# Patient Record
Sex: Female | Born: 1970 | Race: Black or African American | Hispanic: No | Smoking: Current some day smoker
Health system: Southern US, Community
[De-identification: ages and names within clinical notes are randomized; demographics above are authoritative.]

## PROBLEM LIST (undated history)

## (undated) DIAGNOSIS — I890 Lymphedema, not elsewhere classified: Secondary | ICD-10-CM

## (undated) DIAGNOSIS — M069 Rheumatoid arthritis, unspecified: Secondary | ICD-10-CM

## (undated) DIAGNOSIS — C801 Malignant (primary) neoplasm, unspecified: Secondary | ICD-10-CM

## (undated) HISTORY — PX: BREAST LUMPECTOMY: SHX2

## (undated) HISTORY — DX: Rheumatoid arthritis, unspecified: M06.9

## (undated) SURGERY — Surgical Case
Anesthesia: *Unknown

---

## 2002-08-20 ENCOUNTER — Emergency Department (HOSPITAL_COMMUNITY): Admission: EM | Admit: 2002-08-20 | Discharge: 2002-08-20 | Payer: Self-pay | Admitting: Emergency Medicine

## 2002-08-20 ENCOUNTER — Encounter: Payer: Self-pay | Admitting: Emergency Medicine

## 2003-11-17 ENCOUNTER — Emergency Department (HOSPITAL_COMMUNITY): Admission: EM | Admit: 2003-11-17 | Discharge: 2003-11-17 | Payer: Self-pay | Admitting: Emergency Medicine

## 2004-03-20 ENCOUNTER — Ambulatory Visit (HOSPITAL_COMMUNITY): Admission: RE | Admit: 2004-03-20 | Discharge: 2004-03-20 | Payer: Self-pay | Admitting: Family Medicine

## 2004-05-20 ENCOUNTER — Inpatient Hospital Stay (HOSPITAL_COMMUNITY): Admission: RE | Admit: 2004-05-20 | Discharge: 2004-05-21 | Payer: Self-pay | Admitting: Obstetrics and Gynecology

## 2006-06-16 ENCOUNTER — Observation Stay (HOSPITAL_COMMUNITY): Admission: AD | Admit: 2006-06-16 | Discharge: 2006-06-17 | Payer: Self-pay | Admitting: Obstetrics and Gynecology

## 2007-03-20 ENCOUNTER — Emergency Department (HOSPITAL_COMMUNITY): Admission: EM | Admit: 2007-03-20 | Discharge: 2007-03-20 | Payer: Self-pay | Admitting: Emergency Medicine

## 2008-08-13 ENCOUNTER — Ambulatory Visit: Payer: Self-pay | Admitting: Family Medicine

## 2008-08-13 DIAGNOSIS — C50919 Malignant neoplasm of unspecified site of unspecified female breast: Secondary | ICD-10-CM | POA: Insufficient documentation

## 2008-08-13 DIAGNOSIS — O039 Complete or unspecified spontaneous abortion without complication: Secondary | ICD-10-CM | POA: Insufficient documentation

## 2008-08-13 DIAGNOSIS — M545 Low back pain: Secondary | ICD-10-CM

## 2008-08-13 DIAGNOSIS — M25569 Pain in unspecified knee: Secondary | ICD-10-CM | POA: Insufficient documentation

## 2010-12-06 ENCOUNTER — Encounter: Payer: Self-pay | Admitting: Family Medicine

## 2011-04-02 NOTE — Discharge Summary (Signed)
NAME:  [name], ROSELLO                       ACCOUNT NO.:  [id number]   MEDICAL RECORD NO.:  [id number]                   PATIENT TYPE:  OUT   LOCATION:  RAD                                  FACILITY:  [facility]   PHYSICIAN:  Tilda Burrow, M.D.              DATE OF BIRTH:  [birth year]   DATE OF ADMISSION:  05/20/2004  DATE OF DISCHARGE:  05/21/2004                                 DISCHARGE SUMMARY   ADMISSION DIAGNOSES:  Pregnancy [redacted] week gestation, fetal demise in utero.   DISCHARGE DIAGNOSES:  1. Pregnancy [redacted] week gestation.  2. Fetal demise in utero.  3. Intrauterine growth retardation.  4. Placental bed hematoma with atypical placental tissues.   HISTORY OF PRESENT ILLNESS:  This 40 year old female gravida 3, para 0, AB 1  was admitted after being followed initially at [facility]  and transferred to our care on April 27, 2004 with prenatal course considered  initially appropriate with ultrasound estimated date of confinement of  October 04, 2004. She had noticed a decrease in fetal movement since May 15, 2004. When presenting to labor and delivery on May 20, 2004, she was  found by ultrasound to have no fetal motion and fetal demise in utero  confirmed.   PAST MEDICAL HISTORY:  Positive for history of breast cancer in 2001,  treated with lumpectomy and radiation therapy and chemotherapy. No adjuvant  therapies during this pregnancy.   OBSTETRIC HISTORY:  A miscarriage in 1999, first trimester D&C and then an  elective abortion in 1992.   ALLERGIES:  None.   MEDICATIONS:  None.   PHYSICAL EXAMINATION:  GENERAL:  A tearful appropriately grieving African-  American female with fundal height just below the umbilicus. Fetal heart  tones absent. Ultrasound shows no fetal heart motion present. Amniotic fluid  is grossly normal in volume. Blood type is O negative. Rubella immunity  present.   HOSPITAL COURSE:  The patient was admitted. Time given for  initial grief  reaction. Then we started with induction. She progressed to delivery of a  stillborn small 225 gram fetus with placenta distinctly abnormal. It was a  central 2 cm area beneath the placenta that had a blood clot. She fetus  weighed 225 grams and the placenta weighed only 85 grams, distinctly  atypical in its appearance, very thin except for the small area in the  center, which is exactly where the clot had occurred. Pathology evaluation  identified the placenta weighed 62 grams after fixation. The  central hematoma was near the placental bed. Postoperative course was  stable. On the day of delivery, she wished to go home to be with supportive  family members.   ADDRESS:  Blood type O negative. RhoGAM was administered.     ___________________________________________  Tilda Burrow, M.D.   JVF/MEDQ  D:  05/31/2004  T:  06/01/2004  Job:  [id number]

## 2011-04-02 NOTE — Op Note (Signed)
NAME:  [name], [name]                       ACCOUNT NO.:  [id number]   MEDICAL RECORD NO.:  [id number]                   PATIENT TYPE:  INP   LOCATION:  A416                                 FACILITY:  [facility]   PHYSICIAN:  Tilda Burrow, M.D.              DATE OF BIRTH:  16-Jul-1971   DATE OF PROCEDURE:  05/20/2004  DATE OF DISCHARGE:                                 OPERATIVE REPORT   Procedure note and delivery note, 2:00 p.m. on May 20, 2004.   EPIDURAL NOTE:  Patient requested epidural which was placed with patient in  the sitting position, flexed forward.  Nursing exam earlier had shown the  cervix to be very posterior and difficult to access.  We placed epidural  using loss of resistance technique on first attempt, identifying the  epidural space at 4.5 cm beneath the skin.  A 5 mL test dose of 1.5%  Xylocaine with epinephrine was instilled followed by placement of the  epidural catheter to a depth of 4 cm followed by taping to the back and  beginning a 10 mL bolus.  The patient had good symmetric anesthetic effect  beginning to set up on the thighs.  She still had sense of pressure and so  we did a cervical exam which showed the cervix to be 2-3 cm with bulging  forewaters and vertex beginning to push through the cervix at approximately  3 cm dilation.  The patient then progressed within two contractions to begin  to feel eminent delivery.  She was inspected and presenting part notable at  the introitus.  She was able to push and pushed out the intact membrane  containing the intact bag of waters and placenta, intact, with fluid  contained within the sac.  The patient did not wish to see the baby  initially and the fetus and placenta were taken to the nursery and weighed.  The fetus weighed 225 grams, 8.0 ounces, and appeared grossly normal.  The  placenta appeared abnormally small with a small central 2 cm button of dark  ecchymotic tissue just below the cord insertion and the  remainder of the  very small 10 x 10 cm symmetric placenta found to be very thin and with an  atypical surface appearance, suggestive that it had already separated off.  The weight of the placenta was 85 grams, atypically small for this  gestation, cord appeared grossly normal.  The cord insertion was centrally  located.  Photos were taken.  Patient had epidural catheter removed and  after a discussion desired to see the infant.  Placenta will be sent for  pathology.   ADDENDUM:  Addendum catheter tip was intact when it was removed.      ___________________________________________  Tilda Burrow, M.D.   JVF/MEDQ  D:  05/20/2004  T:  05/20/2004  Job:  [id number]

## 2011-04-02 NOTE — H&P (Signed)
[name], [name]             ACCOUNT NO.:  [id number]   MEDICAL RECORD NO.:  [id number]          PATIENT TYPE:  OIB   LOCATION:  LDR2                          FACILITY:  [facility]   PHYSICIAN:  Tilda Burrow, M.D. DATE OF BIRTH:  [birth year]   DATE OF ADMISSION:  06/16/2006  DATE OF DISCHARGE:  LH                                HISTORY & PHYSICAL   ADMISSION DIAGNOSES:  1.  Premature preterm rupture of membranes, [redacted] weeks gestation.  Poor      reproductive history.  2.  History of breast cancer, status post chemotherapy.   HISTORY:  This is a 40 year old female, gravida 4, para 0-1-2-0, at 19  weeks, having received prenatal care at Dr. Maryelizabeth Kaufmann at [facility] who  presents with premature preterm rupture of membranes (PPROM) with gush of  fluid noted this afternoon.  No bleeding, no prenatal complications to date.  She was scheduled for fetal survey ultrasound next week. The patient has had  a difficult reproductive history including a stillbirth in 2005 at [redacted] weeks  gestation.  She was found to have a placental infarction with a severe  growth retarded infant.  She has a significant history of cancer of the left  breast treated with lumpectomy and 42 weeks of chemotherapy and subsequent  adjuvant radiation therapy an additional six weeks.   PAST MEDICAL HISTORY:  Otherwise negative.   ALLERGIES:  None.   PAST SURGICAL HISTORY:  None other than D&C.   PHYSICAL EXAMINATION:  GENERAL:  A highly anxious, upset African American  female with good support.  Family tearful, strong, religious basis.  The  patient is alert, oriented, aware of what is going and wishes to avoid the  reality,  but understands the adverse risks of absence of fluid.  HEENT:  Pupils equal, round, and reactive.   Ultrasound shows single fetus, BPD 43 mm equals 18 weeks 2 days; femur  length 30 mm equal 19 weeks 3 days.  Vertex presentation, severe  oligohydramnios. Speculum exam shows gross pooling of fluid  which pours out  with speculum exam.  Cannot see cervix through the fluid pooling and  continued loss.   IMPRESSION:  Premature preterm rupture of membranes 19 weeks, put on  intravenous antibiotics.  Will be seen in the a.m.  Probable prostaglandin  suppository.  The patient accepts pregnancy loss. I have lengthy discussions  with  the patient's mother and the patient of her prognosis for the pregnancy. We  have also talked to the baby's father and his mother.  Will ultrasound in  the a.m.  In the event that the fluid is not recollected, will recommend  prostaglandin suppository, evacuation of uterus.      Tilda Burrow, M.D.  Electronically Signed     JVF/MEDQ  D:  06/16/2006  T:  06/17/2006  Job:  [id number]   cc:   [facility]   Rozann Lesches  Fax: [phone]

## 2011-04-02 NOTE — H&P (Signed)
NAME:  [name], [name]                       ACCOUNT NO.:  [id number]   MEDICAL RECORD NO.:  [id number]                   PATIENT TYPE:  OIB   LOCATION:  A416                                 FACILITY:  [facility]   PHYSICIAN:  Tilda Burrow, M.D.              DATE OF BIRTH:  [birth year]   DATE OF ADMISSION:  05/20/2004  DATE OF DISCHARGE:                                HISTORY & PHYSICAL   ADMISSION DIAGNOSES:  1. Pregnancy at 23 weeks' gestation.  2. Fetal demise in utero.   HISTORY OF PRESENT ILLNESS:  This 40 year old female, gravida 3, para 0, AB  1, LMP February 22, 2004, placing menstrual [facility] October 04, 2004, was admitted  on May 20, 2004, after presenting with absence of fetal movement since last  Friday.  Pregnancy course has been followed initially at [facility] and transferring to our care June 13.  She was seen for  intake physical at that time, history taken, and fetal heart tones confirmed  with fundal height near the umbilicus at 20 weeks 3 days.  She was seen  eight days later for ultrasound, which showed a suspected gestational age of  [redacted] weeks 2 days, placing her due slightly different.  Ultrasound EDC is  October 04, 2004, based on this ultrasound.  In reviewing prior records,  prior ultrasound at 11 weeks 6 days on Mar 20, 2004, also matched up with the  [facility] of October 03, 2004, suggesting appropriate interval growth.  The  patient has had no bleeding, no complications.  She noticed absent fetal  movement since July 1.  Ultrasound done upon presentation shows a small  fetus in an abnormally collapsed position with no fetal movement and absence  of fetal heart motion on prolonged monitoring and prolonged ultrasound.  The  patient has been informed of our findings and is appropriately grieving.   Plans will be made for cervical ripening with cervical laminaria to be  followed by prostaglandin suppositories.   PAST MEDICAL HISTORY:  Notable for a  history of left breast cancer, 2001,  treated with lumpectomy, radiation therapy, and chemotherapy and neoadjuvant  therapy.  Exact agents are not a part of the records at this time and will  be identified.  She had complications during the chemotherapy of deep vein  thrombosis associated with the PICC line that was placed for chemotherapy  management.  That PICC line was subsequently removed, and there are no  subsequent problems.   Additional OB history is that she has had miscarriage in 1999 in the first  trimester and treated by [facility] and elective AB in 1992.   ALLERGIES:  None.   MEDICATIONS:  None.   SOCIAL HISTORY:  Single.  Mother and father alive and well.  One brother,  alive and well.  She is related to hospital staff member, Jasmine December in the lab.   PHYSICAL EXAMINATION:  GENERAL:  A  pleasant, tearful, appropriately grieving  African-American female, alert and oriented x3.  ABDOMEN:  Fundal height is just below the umbilicus with fetal heart tones  absent and absence of fetal heart movement on ultrasound.  The fetus is in  vertex presentation.  Amniotic fluid is grossly normal to appearance.  EXTREMITIES:  Grossly normal.  PELVIC:  Deferred at this time.   Blood type O negative.  Rubella immunity present.  Hemoglobin 13, hematocrit  39.  HIV negative.  Pap smear performed in the past.  MSAFP one in 500 risk  on old records.   PLAN:  Will work with family at their pace today and proceed with assisting  with delivery of fetal demise in utero.     ___________________________________________                                         Tilda Burrow, M.D.   JVF/MEDQ  D:  05/20/2004  T:  05/20/2004  Job:  [id number]

## 2016-06-12 ENCOUNTER — Emergency Department (HOSPITAL_COMMUNITY)
Admission: EM | Admit: 2016-06-12 | Discharge: 2016-06-12 | Disposition: A | Discharge status: Home / Self Care | Payer: Medicaid Other | Attending: Emergency Medicine | Admitting: Emergency Medicine

## 2016-06-12 ENCOUNTER — Emergency Department (HOSPITAL_COMMUNITY): Payer: Medicaid Other

## 2016-06-12 ENCOUNTER — Encounter (HOSPITAL_COMMUNITY): Payer: Self-pay | Admitting: Emergency Medicine

## 2016-06-12 DIAGNOSIS — M549 Dorsalgia, unspecified: Secondary | ICD-10-CM

## 2016-06-12 DIAGNOSIS — F17210 Nicotine dependence, cigarettes, uncomplicated: Secondary | ICD-10-CM | POA: Diagnosis not present

## 2016-06-12 DIAGNOSIS — Z791 Long term (current) use of non-steroidal anti-inflammatories (NSAID): Secondary | ICD-10-CM | POA: Diagnosis not present

## 2016-06-12 DIAGNOSIS — Z79899 Other long term (current) drug therapy: Secondary | ICD-10-CM | POA: Diagnosis not present

## 2016-06-12 DIAGNOSIS — M545 Low back pain: Secondary | ICD-10-CM | POA: Insufficient documentation

## 2016-06-12 HISTORY — DX: Malignant (primary) neoplasm, unspecified: C80.1

## 2016-06-12 LAB — URINALYSIS, ROUTINE W REFLEX MICROSCOPIC
Bilirubin Urine: NEGATIVE
Glucose, UA: NEGATIVE mg/dL
Ketones, ur: NEGATIVE mg/dL
Leukocytes, UA: NEGATIVE
Nitrite: NEGATIVE
Protein, ur: NEGATIVE mg/dL
Specific Gravity, Urine: 1.025 (ref 1.005–1.030)
pH: 6 (ref 5.0–8.0)

## 2016-06-12 LAB — URINE MICROSCOPIC-ADD ON

## 2016-06-12 LAB — PREGNANCY, URINE: Preg Test, Ur: NEGATIVE

## 2016-06-12 MED ORDER — IBUPROFEN 800 MG PO TABS: 800.0000 mg | ORAL_TABLET | Freq: Three times a day (TID) | ORAL | 0 refills | Status: DC

## 2016-06-12 MED ORDER — OXYCODONE-ACETAMINOPHEN 5-325 MG PO TABS
2.0000 | ORAL_TABLET | Freq: Once | ORAL | Status: AC
  Administered 2016-06-12: 2 via ORAL
  Filled 2016-06-12: qty 2

## 2016-06-12 MED ORDER — METHOCARBAMOL 500 MG PO TABS: 500.0000 mg | ORAL_TABLET | Freq: Two times a day (BID) | ORAL | 0 refills | Status: DC | PRN

## 2016-06-12 NOTE — ED Provider Notes (Signed)
[place] DEPT Provider Note   CSN: [id number] Arrival date & time: 06/12/16  [id number]  First Provider Contact:  First MD Initiated Contact with Patient 06/12/16 0740        History   Chief Complaint Chief Complaint  Patient presents with  . Back Pain    HPI [name] is a 45 y.o. female.  The patient is a 45 year old female, she has a history of breast cancer diagnosed in her late 54s, she underwent a lumpectomy of the left breast and states that she has been cancer free for 20 years, follows with a cancer clinic, most recently seen 6 months ago. She does not have any history of back pain or trauma or back surgery, she has no history of fevers, urinary symptoms or IV drug use. She reports that approximately one week ago while she was getting from a sitting to a standing position out of a chair while she was in class, she developed acute onset of lower back pain where she felt as though her back was acutely painful and locked up. Since that time she has had almost persistent pain, seems to be worse with change of position especially with going from a sitting to a standing position. The pain is constant, seems to be located in the mid lower back, there is no radiation to the legs, no numbness or weakness of the legs, no urinary retention, no frequency, no dysuria. She has no history of IV drug use and has not had any tattoos in over 20 years. She has used her home medications including her Flexeril without relief.      Past Medical History:  Diagnosis Date  . Cancer [facility])     Patient Active Problem List   Diagnosis Date Noted  . NEOPLASM, MALIGNANT, BREAST, LEFT 08/13/2008  . UNSPEC SPONTANEOUS AB WITHOUT MENTION COMP 08/13/2008  . KNEE PAIN, RIGHT 08/13/2008  . BACK PAIN, LUMBAR 08/13/2008    Past Surgical History:  Procedure Laterality Date  . BREAST LUMPECTOMY Left     OB History    Gravida Para Term Preterm AB Living   1 1   1   1    SAB TAB Ectopic Multiple  Live Births                   Home Medications    Prior to Admission medications   Medication Sig Start Date End Date Taking? Authorizing Provider  ibuprofen (ADVIL,MOTRIN) 800 MG tablet Take 1 tablet (800 mg total) by mouth 3 (three) times daily. 06/12/16   Noemi Chapel, MD  methocarbamol (ROBAXIN) 500 MG tablet Take 1 tablet (500 mg total) by mouth 2 (two) times daily as needed for muscle spasms. 06/12/16   Noemi Chapel, MD    Family History Family History  Problem Relation Age of Onset  . Hypertension Other   . Diabetes Other     Social History Social History  Substance Use Topics  . Smoking status: Current Every Day Smoker    Packs/day: 0.50    Years: 10.00    Types: Cigarettes  . Smokeless tobacco: Never Used  . Alcohol use No     Allergies   Amoxicillin   Review of Systems Review of Systems  All other systems reviewed and are negative.    Physical Exam Updated Vital Signs BP 124/79 (BP Location: Right Arm)   Pulse 80   Temp 97.8 F (36.6 C) (Oral)   Resp 16   Ht 5\' 5"  (1.651 m)  Wt 160 lb (72.6 kg)   LMP 05/22/2016 (Approximate)   SpO2 98%   BMI 26.63 kg/m   Physical Exam  Constitutional: She appears well-developed and well-nourished. No distress.  HENT:  Head: Normocephalic and atraumatic.  Mouth/Throat: Oropharynx is clear and moist. No oropharyngeal exudate.  Eyes: Conjunctivae and EOM are normal. Pupils are equal, round, and reactive to light. Right eye exhibits no discharge. Left eye exhibits no discharge. No scleral icterus.  Neck: Normal range of motion. Neck supple. No JVD present. No thyromegaly present.  Cardiovascular: Normal rate, regular rhythm, normal heart sounds and intact distal pulses.  Exam reveals no gallop and no friction rub.   No murmur heard. Pulmonary/Chest: Effort normal and breath sounds normal. No respiratory distress. She has no wheezes. She has no rales.  Abdominal: Soft. Bowel sounds are normal. She exhibits no  distension and no mass. There is no tenderness.  Musculoskeletal: Normal range of motion. She exhibits tenderness ( Minimal tenderness with palpation over the mid lumbar spine, no paraspinal tenderness, no thoracic tenderness). She exhibits no edema.  Lymphadenopathy:    She has no cervical adenopathy.  Neurological: She is alert. Coordination normal.  The patient is able to straight leg raise bilaterally against resistance with normal strength, she has normal sensation to light touch in all 4 extremities, normal coordination, normal speech  Skin: Skin is warm and dry. No rash noted. No erythema.  Psychiatric: She has a normal mood and affect. Her behavior is normal.  Nursing note and vitals reviewed.    ED Treatments / Results  Labs (all labs ordered are listed, but only abnormal results are displayed) Labs Reviewed  URINALYSIS, ROUTINE W REFLEX MICROSCOPIC (NOT AT [facility]) - Abnormal; Notable for the following:       Result Value   Hgb urine dipstick MODERATE (*)    All other components within normal limits  URINE MICROSCOPIC-ADD ON - Abnormal; Notable for the following:    Squamous Epithelial / LPF 6-30 (*)    Bacteria, UA FEW (*)    All other components within normal limits  PREGNANCY, URINE     Radiology Dg Lumbar Spine Complete  Result Date: 06/12/2016 CLINICAL DATA:  Low back pain. EXAM: LUMBAR SPINE - COMPLETE 4+ VIEW COMPARISON:  None. FINDINGS: Tiny anterior osteophytes at L3-4. No fracture or malalignment. No other abnormalities. IMPRESSION: No cause for the patient's pain identified. Electronically Signed   By: Dorise Bullion III M.D   On: 06/12/2016 09:08   Procedures Procedures (including critical care time)  Medications Ordered in ED Medications  oxyCODONE-acetaminophen (PERCOCET/ROXICET) 5-325 MG per tablet 2 tablet (2 tablets Oral Given 06/12/16 [id number])   Initial Impression / Assessment and Plan / ED Course  I have reviewed the triage vital signs and the nursing  notes.  Pertinent labs & imaging results that were available during my care of the patient were reviewed by me and considered in my medical decision making (see chart for details).  Clinical Course  Comment By Time  Urinalysis negative, imaging negative for acute findings that would be causing her pain, specifically no signs of metastatic disease. The patient appears well, she appears stable for discharge and can follow-up in the outpatient setting. She was informed of all of her results Noemi Chapel, MD 07/29 [phone]    Back pain is new for this patient, doubt fracture, however with her history of breast cancer we'll obtain imaging of the back, pain medications have been ordered, check urinalysis, the patient is  in agreement with the plan.  Final Clinical Impressions(s) / ED Diagnoses   Final diagnoses:  Back pain, unspecified location    New Prescriptions New Prescriptions   IBUPROFEN (ADVIL,MOTRIN) 800 MG TABLET    Take 1 tablet (800 mg total) by mouth 3 (three) times daily.   METHOCARBAMOL (ROBAXIN) 500 MG TABLET    Take 1 tablet (500 mg total) by mouth 2 (two) times daily as needed for muscle spasms.     Noemi Chapel, MD 06/12/16 [phone]

## 2016-06-12 NOTE — Discharge Instructions (Signed)

## 2016-06-12 NOTE — ED Triage Notes (Signed)
Patient c/o lower back pain that started a week ago. Patient denies any known injury. Per patient worse with laying down. Patient states pain radiates into abd when laying. Denies any urinary symptoms. Per patient regular normal BMs. Denies any blood in urine.

## 2016-08-21 ENCOUNTER — Encounter (HOSPITAL_COMMUNITY): Payer: Self-pay | Admitting: Emergency Medicine

## 2016-08-21 ENCOUNTER — Emergency Department (HOSPITAL_COMMUNITY)
Admission: EM | Admit: 2016-08-21 | Discharge: 2016-08-21 | Disposition: A | Discharge status: Home / Self Care | Payer: Medicaid Other | Attending: Emergency Medicine | Admitting: Emergency Medicine

## 2016-08-21 DIAGNOSIS — F17210 Nicotine dependence, cigarettes, uncomplicated: Secondary | ICD-10-CM | POA: Insufficient documentation

## 2016-08-21 DIAGNOSIS — R21 Rash and other nonspecific skin eruption: Secondary | ICD-10-CM | POA: Diagnosis not present

## 2016-08-21 DIAGNOSIS — R509 Fever, unspecified: Secondary | ICD-10-CM | POA: Insufficient documentation

## 2016-08-21 MED ORDER — HYDROCORTISONE 1 % EX CREA: TOPICAL_CREAM | CUTANEOUS | 0 refills | Status: DC

## 2016-08-21 MED ORDER — CETIRIZINE HCL 10 MG PO TABS: 10.0000 mg | ORAL_TABLET | Freq: Every day | ORAL | 0 refills | Status: DC

## 2016-08-21 NOTE — ED Notes (Signed)
Pt called out stating she had a death in the family and needs to leave. While pt is putting clothing on she asked "Can ya'll call [facility] and see how long their wait is?". PA Meyer notified.

## 2016-08-21 NOTE — ED Notes (Signed)
Pt becoming increasingly verbally aggressive when informed about her prescriptions. Pt states "I remember now why I don't come up in here anymore", "I done told you I had this and it didn't help, y'all don't listen". Pt slammed d/c papers down. Nurse asked pt if she would like to speak to PA or see if prescription could be changed. Pt denied and walked out of room.

## 2016-08-21 NOTE — ED Provider Notes (Signed)
[facility] DEPT Provider Note   CSN: [id number] Arrival date & time: 08/21/16  1810     History   Chief Complaint Chief Complaint  Patient presents with  . Rash    HPI [name] is a 45 y.o. female.  [name] is a 45 y.o. female presents to ED with complaint of rash to right triceps. Onset earlier today. She reports area of redness that has increased in size with associated warmth, swelling, pruritis, and burning like sensation. She took her temperature earlier and noted it to be 99. Denies any drainage. No other areas of rash noted. She denies oral lesions, trouble swallowing, trouble breathing, chest tightness, chest pain, abdominal pain, N/V, lightheadedness. She is unsure if she was bit by anything. She denies any recent changes to soaps, lotions, or detergents. She does endorse seasonal allergies. She was recently diagnosed with sinusitis; she was started on doxycycline and developed vomiting, she was switched to levaquin. She has taken two doses of levaquin. She has tried neosporin and hydrocortisone cream with transient relief.       Past Medical History:  Diagnosis Date  . Cancer [facility])     Patient Active Problem List   Diagnosis Date Noted  . NEOPLASM, MALIGNANT, BREAST, LEFT 08/13/2008  . UNSPEC SPONTANEOUS AB WITHOUT MENTION COMP 08/13/2008  . KNEE PAIN, RIGHT 08/13/2008  . BACK PAIN, LUMBAR 08/13/2008    Past Surgical History:  Procedure Laterality Date  . BREAST LUMPECTOMY Left     OB History    Gravida Para Term Preterm AB Living   1 1   1   1    SAB TAB Ectopic Multiple Live Births                   Home Medications    Prior to Admission medications   Medication Sig Start Date End Date Taking? Authorizing Provider  cetirizine (ZYRTEC) 10 MG tablet Take 1 tablet (10 mg total) by mouth daily. 08/21/16   Roxanna Mew, PA-C  hydrocortisone cream 1 % Apply to affected area 2 times daily 08/21/16   Roxanna Mew, PA-C    ibuprofen (ADVIL,MOTRIN) 800 MG tablet Take 1 tablet (800 mg total) by mouth 3 (three) times daily. 06/12/16   Noemi Chapel, MD  methocarbamol (ROBAXIN) 500 MG tablet Take 1 tablet (500 mg total) by mouth 2 (two) times daily as needed for muscle spasms. 06/12/16   Noemi Chapel, MD    Family History Family History  Problem Relation Age of Onset  . Hypertension Other   . Diabetes Other     Social History Social History  Substance Use Topics  . Smoking status: Current Every Day Smoker    Packs/day: 0.50    Years: 10.00    Types: Cigarettes  . Smokeless tobacco: Never Used  . Alcohol use No     Allergies   Amoxicillin   Review of Systems Review of Systems  Constitutional: Positive for fever ( patient reports 99).  HENT: Negative for mouth sores and trouble swallowing.   Respiratory: Negative for chest tightness and shortness of breath.   Cardiovascular: Negative for chest pain.  Gastrointestinal: Negative for abdominal pain, nausea and vomiting.  Musculoskeletal: Negative for arthralgias and myalgias.  Skin: Positive for rash.  Neurological: Negative for dizziness and light-headedness.     Physical Exam Updated Vital Signs BP 140/92 (BP Location: Right Arm)   Pulse 81   Temp 97.6 F (36.4 C) (Temporal)   Resp 18  Ht 5\' 5"  (1.651 m)   Wt 72.6 kg   LMP 08/07/2016   SpO2 100%   BMI 26.63 kg/m   Physical Exam  Constitutional: She appears well-developed and well-nourished. No distress.  HENT:  Head: Normocephalic and atraumatic.  Mouth/Throat: Uvula is midline, oropharynx is clear and moist and mucous membranes are normal. No trismus in the jaw. No uvula swelling. No posterior oropharyngeal edema.  No trismus. No oral lesions. Uvula is midline without swelling. No posterior oropharynx edema. Managing oral secretions.   Eyes: Conjunctivae and EOM are normal. Pupils are equal, round, and reactive to light. Right eye exhibits no discharge. Left eye exhibits no  discharge. No scleral icterus.  Neck: Normal range of motion. Neck supple. No neck rigidity.  No nuchal rigidity.   Cardiovascular: Normal rate, regular rhythm, normal heart sounds and intact distal pulses.   No murmur heard. Pulmonary/Chest: Effort normal and breath sounds normal. No stridor. No respiratory distress. She has no wheezes.  Abdominal: Soft. Bowel sounds are normal. She exhibits no distension. There is no tenderness.  Lymphadenopathy:    She has no cervical adenopathy.  Neurological: She is alert.  Skin: Skin is warm and dry. She is not diaphoretic.     No lesions noted on palms. No oral lesions.  Psychiatric: She has a normal mood and affect. Her behavior is normal.     ED Treatments / Results  Labs (all labs ordered are listed, but only abnormal results are displayed) Labs Reviewed - No data to display  EKG  EKG Interpretation None       Radiology No results found.  Procedures Procedures (including critical care time)  Medications Ordered in ED Medications - No data to display   Initial Impression / Assessment and Plan / ED Course  I have reviewed the triage vital signs and the nursing notes.  Pertinent labs & imaging results that were available during my care of the patient were reviewed by me and considered in my medical decision making (see chart for details).  Clinical Course    Patient presents to ED with complaint of rash onset today. Patient is afebrile and non-toxic appearing in NAD. VSS. Physical remarkable for 7cm erythematous eruption to right triceps with mild warmth. No fluctuance, drainage, bullae, or streaking noted. No oral lesions. No lesions on hands. No oropharynx swelling. Managing oral secretions. No stridor. Lungs are CTABL. No evidence of anaphylaxis. No sign of infection. Low suspicion for SJS. Suspect ?insect bite with local reaction vs. ?drug rash from recent ABX. Discussed results and plan with patient. Symptomatic management  to include anti-histamine for itch relief, topical hydrocortisone, and ice affected area. Rx hydrocortisone and zyrtec. D/c ABX for possible drug reaction and call PCP for ABX change. Follow up with PCP if sxs persist. Return precautions given. Patient voiced understanding.                   Final Clinical Impressions(s) / ED Diagnoses   Final diagnoses:  Rash    New Prescriptions Discharge Medication List as of 08/21/2016  8:56 PM       Roxanna Mew, PA-C 08/22/16 [place], DO 08/23/16 1710

## 2016-08-21 NOTE — Discharge Instructions (Signed)
Read the information below.  Please discontinue the antibiotic and follow up with your primary provider regarding treatment.  You can take oral cetirizine and apply topical hydrocortisone cream to affected area for symptomatic relief. You can apply ice to affected area.  Use the prescribed medication as directed.  Please discuss all new medications with your pharmacist.   Be sure to follow up with your primary provider regarding antibiotic treatment for sinusitis.  You may return to the Emergency Department at any time for worsening condition or any new symptoms that concern you. Return to ED if you develop fever, body aches, oral lesions, increased size of redness, trouble swallowing, trouble breathing, abdominal pain, vomiting or any other new/concerning symptoms.

## 2016-08-21 NOTE — ED Triage Notes (Addendum)
Patient c/o rash to right triceps with severe itching. Patient reports low grade fever. Denies any nausea or vomiting. Patient also states that she has been taking Cipro for sinus infection. Patient reports she has taking 2 doses. Was taking doxycycline before but started vomiting with this. Per patient feels jittery. Denies any difficulty breathing or swallowing.

## 2017-09-27 ENCOUNTER — Encounter (HOSPITAL_COMMUNITY): Payer: Self-pay | Admitting: Emergency Medicine

## 2017-09-27 ENCOUNTER — Emergency Department (HOSPITAL_COMMUNITY)
Admission: EM | Admit: 2017-09-27 | Discharge: 2017-09-27 | Disposition: A | Discharge status: Home / Self Care | Payer: Medicaid Other | Attending: Emergency Medicine | Admitting: Emergency Medicine

## 2017-09-27 ENCOUNTER — Other Ambulatory Visit: Payer: Self-pay

## 2017-09-27 ENCOUNTER — Emergency Department (HOSPITAL_COMMUNITY): Payer: Medicaid Other

## 2017-09-27 DIAGNOSIS — F17210 Nicotine dependence, cigarettes, uncomplicated: Secondary | ICD-10-CM | POA: Diagnosis not present

## 2017-09-27 DIAGNOSIS — Z79899 Other long term (current) drug therapy: Secondary | ICD-10-CM | POA: Insufficient documentation

## 2017-09-27 DIAGNOSIS — Z853 Personal history of malignant neoplasm of breast: Secondary | ICD-10-CM | POA: Diagnosis not present

## 2017-09-27 DIAGNOSIS — M25572 Pain in left ankle and joints of left foot: Secondary | ICD-10-CM | POA: Insufficient documentation

## 2017-09-27 LAB — CBC WITH DIFFERENTIAL/PLATELET
BASOS ABS: 0 10*3/uL (ref 0.0–0.1)
Basophils Relative: 0 %
EOS ABS: 0.2 10*3/uL (ref 0.0–0.7)
EOS PCT: 3 %
HCT: 41.4 % (ref 36.0–46.0)
Hemoglobin: 13.7 g/dL (ref 12.0–15.0)
LYMPHS PCT: 33 %
Lymphs Abs: 3 10*3/uL (ref 0.7–4.0)
MCH: 30.4 pg (ref 26.0–34.0)
MCHC: 33.1 g/dL (ref 30.0–36.0)
MCV: 91.8 fL (ref 78.0–100.0)
MONO ABS: 0.4 10*3/uL (ref 0.1–1.0)
Monocytes Relative: 4 %
Neutro Abs: 5.5 10*3/uL (ref 1.7–7.7)
Neutrophils Relative %: 60 %
PLATELETS: 325 10*3/uL (ref 150–400)
RBC: 4.51 MIL/uL (ref 3.87–5.11)
RDW: 14.4 % (ref 11.5–15.5)
WBC: 9.2 10*3/uL (ref 4.0–10.5)

## 2017-09-27 LAB — URIC ACID: URIC ACID, SERUM: 5.2 mg/dL (ref 2.3–6.6)

## 2017-09-27 NOTE — ED Notes (Signed)
EDP at bedside at this time.  

## 2017-09-27 NOTE — ED Notes (Signed)
Patient currently in xray ?

## 2017-09-27 NOTE — Progress Notes (Signed)
Orthopedic Tech Progress Note Patient Details:  [name] 04/27/1971 [id number]  Ortho Devices Type of Ortho Device: ASO Ortho Device/Splint Interventions: Application   [place] Pink 09/27/2017, 3:17 PM

## 2017-09-27 NOTE — ED Triage Notes (Signed)
Pt to ER for evaluation of left foot swelling without injury 2 weeks ago. VSS. No medical hx. Denies gout. Denies diabetes. Ambulatory.

## 2017-09-27 NOTE — ED Notes (Signed)
Attempted to draw blood without success phlebotomy notified

## 2017-09-27 NOTE — ED Notes (Signed)
See EDP secondary assessment.  

## 2017-09-27 NOTE — ED Notes (Signed)
PA at bedside at this time.  

## 2017-09-27 NOTE — Discharge Instructions (Signed)
The x-rays of your foot and ankle did not show any fractures. The lab work did not show any signs of infection.  Keep your ankle in the brace we have provided when walking. Try to rest and keep the ankle elevated. Apply ice intermittently. Continue to take ibuprofen and Tylenol as needed for pain.  Follow up with your primary provider in the next 2 days for re-evaluation. Return to the emergency department if you have any new or worsening symptoms.

## 2017-09-27 NOTE — ED Provider Notes (Signed)
[facility] EMERGENCY DEPARTMENT Provider Note   CSN: [id number] Arrival date & time: 09/27/17  1100     History   Chief Complaint Chief Complaint  Patient presents with  . Foot Pain    HPI [name] is a 46 y.o. female that presents with complaint of  L ankle pain/swelling x 2.5 weeks. Patient relays she had no injury or change in activity with onset of symptoms, first noted after a day at work. States she does a large amount of ambulating at work, but this has not changed recently. Describes the pain as an aching/throbbing. Has tried Tyelnol, Ibuprofen, icy hot, and warm soaks without relief. Pain is worse with walking and wearing shoes, improves slightly with rest. Has felt warm/cold, but has not taken a temperature to check for fever. Denies nausea, vomiting, rash, numbness, tingling, dysuria, or vaginal discharge. Denies any recent open wounds/cuts or insect bites.   HPI  Past Medical History:  Diagnosis Date  . Cancer [facility])     Patient Active Problem List   Diagnosis Date Noted  . NEOPLASM, MALIGNANT, BREAST, LEFT 08/13/2008  . UNSPEC SPONTANEOUS AB WITHOUT MENTION COMP 08/13/2008  . KNEE PAIN, RIGHT 08/13/2008  . BACK PAIN, LUMBAR 08/13/2008    Past Surgical History:  Procedure Laterality Date  . BREAST LUMPECTOMY Left     OB History    Gravida Para Term Preterm AB Living   1 1   1   1    SAB TAB Ectopic Multiple Live Births                   Home Medications    Prior to Admission medications   Medication Sig Start Date End Date Taking? Authorizing Provider  cetirizine (ZYRTEC) 10 MG tablet Take 1 tablet (10 mg total) by mouth daily. 08/21/16   Frederica Kuster, PA-C  hydrocortisone cream 1 % Apply to affected area 2 times daily 08/21/16   Frederica Kuster, PA-C  ibuprofen (ADVIL,MOTRIN) 800 MG tablet Take 1 tablet (800 mg total) by mouth 3 (three) times daily. 06/12/16   Noemi Chapel, MD  methocarbamol (ROBAXIN) 500 MG tablet Take 1  tablet (500 mg total) by mouth 2 (two) times daily as needed for muscle spasms. 06/12/16   Noemi Chapel, MD    Family History Family History  Problem Relation Age of Onset  . Hypertension Other   . Diabetes Other     Social History Social History   Tobacco Use  . Smoking status: Current Every Day Smoker    Packs/day: 0.50    Years: 10.00    Pack years: 5.00    Types: Cigarettes  . Smokeless tobacco: Never Used  Substance Use Topics  . Alcohol use: No  . Drug use: No     Allergies   Amoxicillin   Review of Systems Review of Systems  Constitutional: Negative for fever.  Respiratory: Negative for shortness of breath.   Cardiovascular: Negative for chest pain.  Gastrointestinal: Negative for abdominal pain, diarrhea, nausea and vomiting.  Genitourinary: Negative for dysuria and vaginal discharge.  Musculoskeletal: Positive for arthralgias (L ankle) and joint swelling (L ankle).  Skin: Negative for rash.  Neurological: Negative for weakness and numbness.  All other systems reviewed and are negative.    Physical Exam Updated Vital Signs BP 111/86 (BP Location: Right Arm)   Pulse 72   Temp 98.4 F (36.9 C) (Oral)   Resp 18   SpO2 100%   Physical  Exam  Constitutional: She appears well-developed and well-nourished. No distress.  HENT:  Head: Normocephalic and atraumatic.  Eyes: Conjunctivae are normal. Right eye exhibits no discharge. Left eye exhibits no discharge.  Cardiovascular: Normal rate and regular rhythm.  Pulmonary/Chest: Effort normal and breath sounds normal. No respiratory distress.  Abdominal: Soft. She exhibits no distension. There is no tenderness.  Musculoskeletal:  Bilateral Lower Extremities: Full AROM of ankle and knee, 5/5 strength with ankle plantar/dorsi flexion. 2+ DP pulses bilaterally, capillary refill < 2 seconds, sensation intact to sharp and dull touch. No rashes, erythema, or open wounds.   Left Lower Extremity: Diffuse swelling to  the L ankle, most prominently laterally, extending to mid foot. Patient is diffusely tender throughout the ankle and mid foot with specific medial/lateral malleolus, navicular, and base of 5th metatarsal tenderness. Metatarsals are otherwise nontender. No tenderness to the phalanges. Anterior tibia is somewhat tender distally. No bony tenderness at the knee joint, no fibular tenderness.   Neurological: She is alert.  Clear speech.   Skin: Skin is warm and dry. No erythema.  Psychiatric: She has a normal mood and affect. Her behavior is normal. Thought content normal.  Nursing note and vitals reviewed.    ED Treatments / Results   Results for orders placed or performed during the hospital encounter of 09/27/17  Uric acid  Result Value Ref Range   Uric Acid, Serum 5.2 2.3 - 6.6 mg/dL  CBC with Differential  Result Value Ref Range   WBC 9.2 4.0 - 10.5 K/uL   RBC 4.51 3.87 - 5.11 MIL/uL   Hemoglobin 13.7 12.0 - 15.0 g/dL   HCT 41.4 36.0 - 46.0 %   MCV 91.8 78.0 - 100.0 fL   MCH 30.4 26.0 - 34.0 pg   MCHC 33.1 30.0 - 36.0 g/dL   RDW 14.4 11.5 - 15.5 %   Platelets 325 150 - 400 K/uL   Neutrophils Relative % 60 %   Neutro Abs 5.5 1.7 - 7.7 K/uL   Lymphocytes Relative 33 %   Lymphs Abs 3.0 0.7 - 4.0 K/uL   Monocytes Relative 4 %   Monocytes Absolute 0.4 0.1 - 1.0 K/uL   Eosinophils Relative 3 %   Eosinophils Absolute 0.2 0.0 - 0.7 K/uL   Basophils Relative 0 %   Basophils Absolute 0.0 0.0 - 0.1 K/uL   Dg Ankle Complete Left  Result Date: 09/27/2017 CLINICAL DATA:  Painful to bear weight. Swelling without injury 2 weeks ago. EXAM: LEFT ANKLE COMPLETE - 3+ VIEW COMPARISON:  None. FINDINGS: There is no evidence of fracture, dislocation, or joint effusion. There is no evidence of arthropathy or other focal bone abnormality. Soft tissue swelling. IMPRESSION: No acute osseous findings.  Soft tissue swelling. Electronically Signed   By: Staci Righter M.D.   On: 09/27/2017 13:40   Dg Foot  Complete Left  Result Date: 09/27/2017 CLINICAL DATA:  Swelling without injury 2 weeks ago. Painful to bear weight. EXAM: LEFT FOOT - COMPLETE 3+ VIEW COMPARISON:  None. FINDINGS: There is no evidence of fracture or dislocation. There is no evidence of arthropathy or other focal bone abnormality. Soft tissue swelling. IMPRESSION: Negative for fracture.  Soft tissue swelling. Electronically Signed   By: Staci Righter M.D.   On: 09/27/2017 13:41     Initial Impression / Assessment and Plan / ED Course  I have reviewed the triage vital signs and the nursing notes.  Pertinent labs & imaging results that were available during my care  of the patient were reviewed by me and considered in my medical decision making (see chart for details).  Patient presents with L ankle pain/swelling. She is nontoxic appearing with stable vital signs. Doubt fracture given negative X-ray of the foot and ankle. Doubt septic joint given patient is afebrile, WBC count WNL, and no hx of open wound or concern for STI. Considered gout, however joint is not warm or erythematous.   Instructed patient to continue to rest the ankle and treat pain with Tylenol and Ibuprofen. Ice when possible and keep elevated. Applied brace to the ankle for patient to wear when ambulating. Discussed need for PCP follow up in 2 days and that patient should return to the ED for any new/concerning or worsening symptoms.   Final Clinical Impressions(s) / ED Diagnoses   Final diagnoses:  Acute left ankle pain    ED Discharge Orders    None        Amaryllis Dyke, PA-C 09/27/17 1508    Isla Pence, MD 09/27/17 1527

## 2017-09-27 NOTE — ED Notes (Signed)
Ortho notified of need for ASO ankle brace.

## 2017-11-23 ENCOUNTER — Other Ambulatory Visit: Payer: Self-pay | Admitting: Family Medicine

## 2017-11-23 DIAGNOSIS — N644 Mastodynia: Secondary | ICD-10-CM

## 2019-03-30 IMAGING — DX DG ANKLE COMPLETE 3+V*L*
3 series · 3 of 3 positions shown · non-contrast
Comparison: None.

CLINICAL DATA: Painful to bear weight. Swelling without injury 2
weeks ago.

EXAM:
LEFT ANKLE COMPLETE - 3+ VIEW

[x ankle ap left]
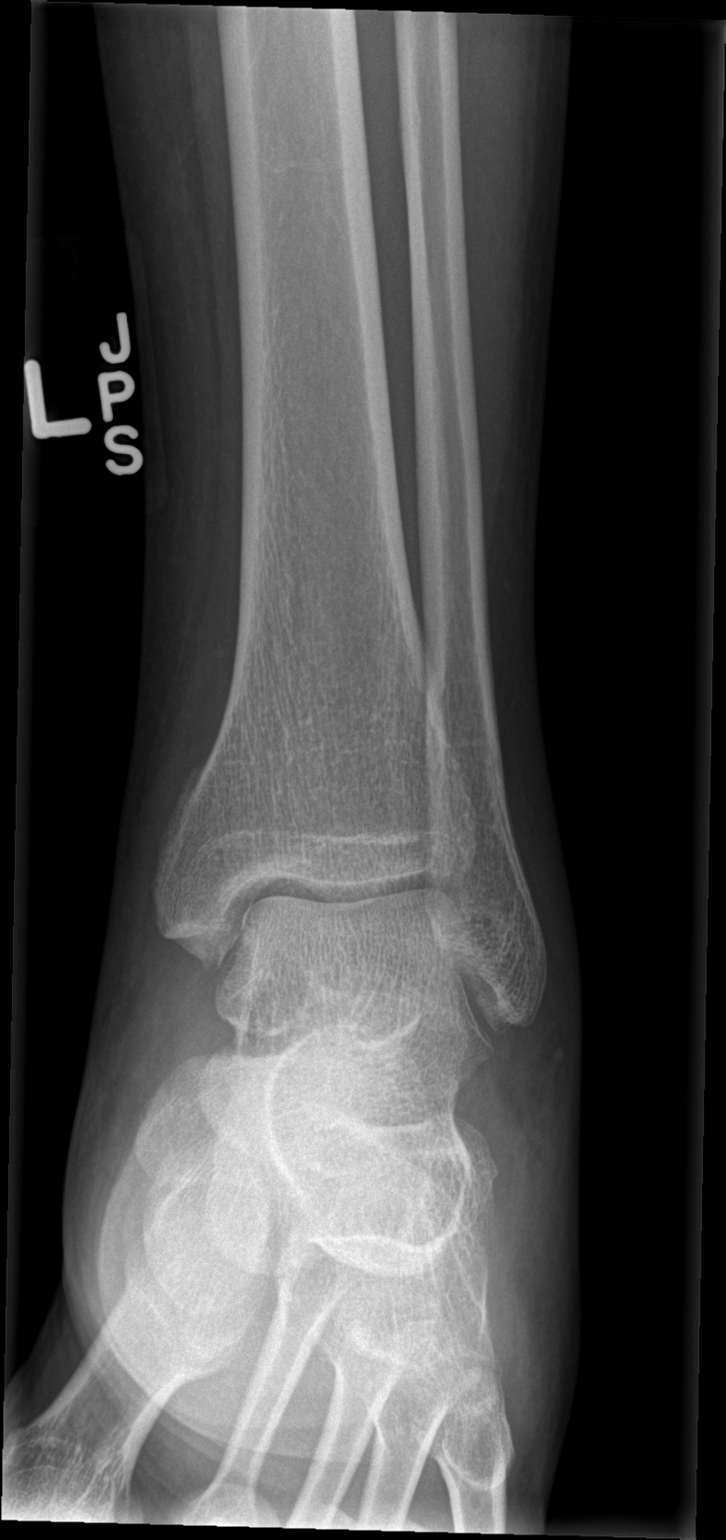

[x ankle obl left]
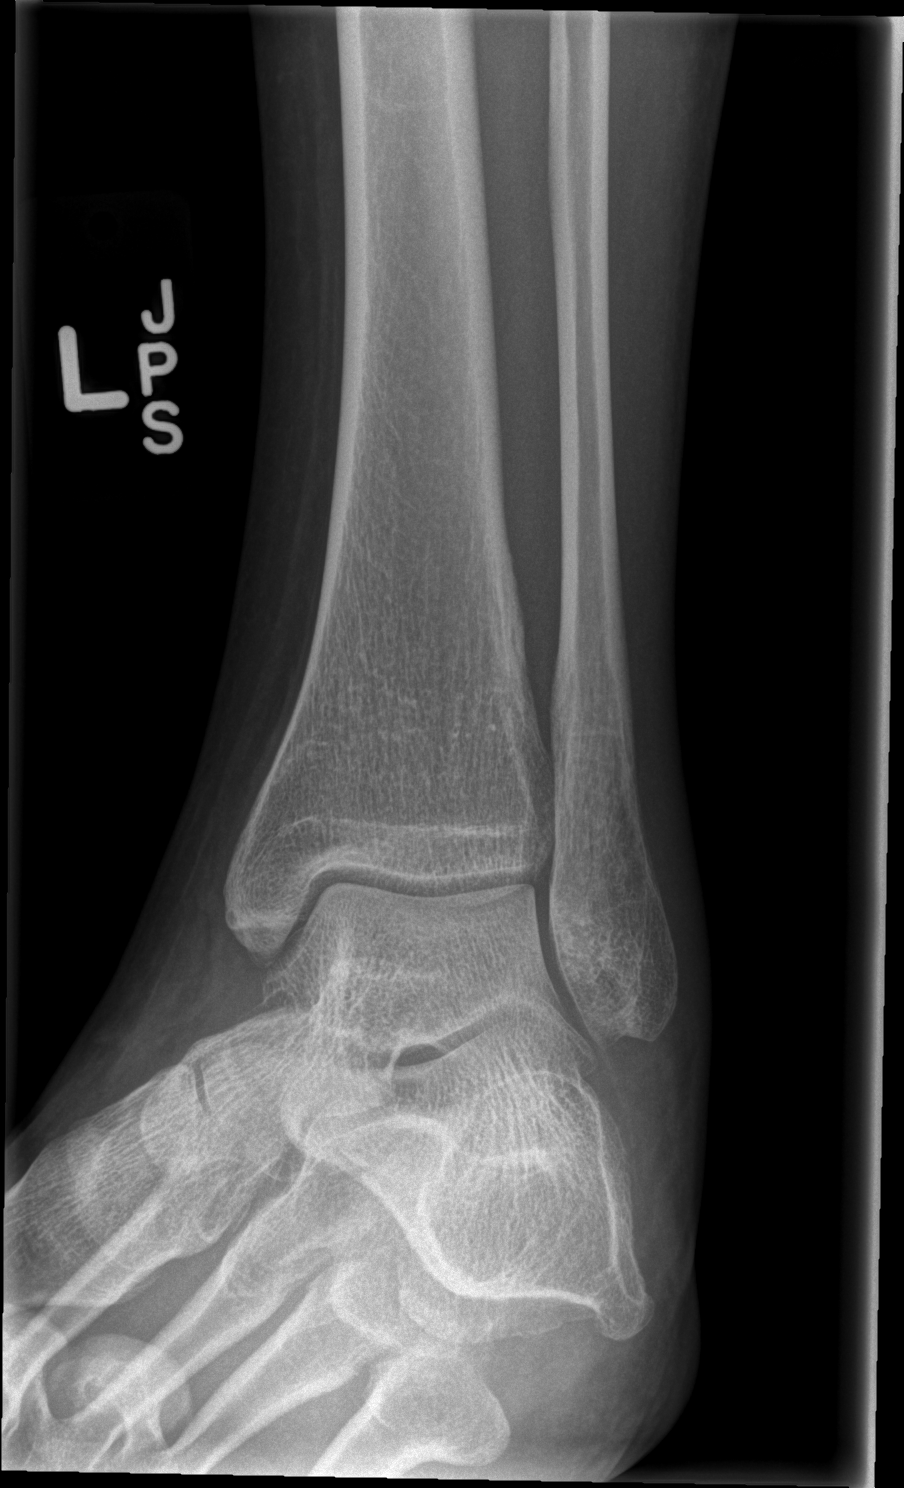

[x ankle lat left]
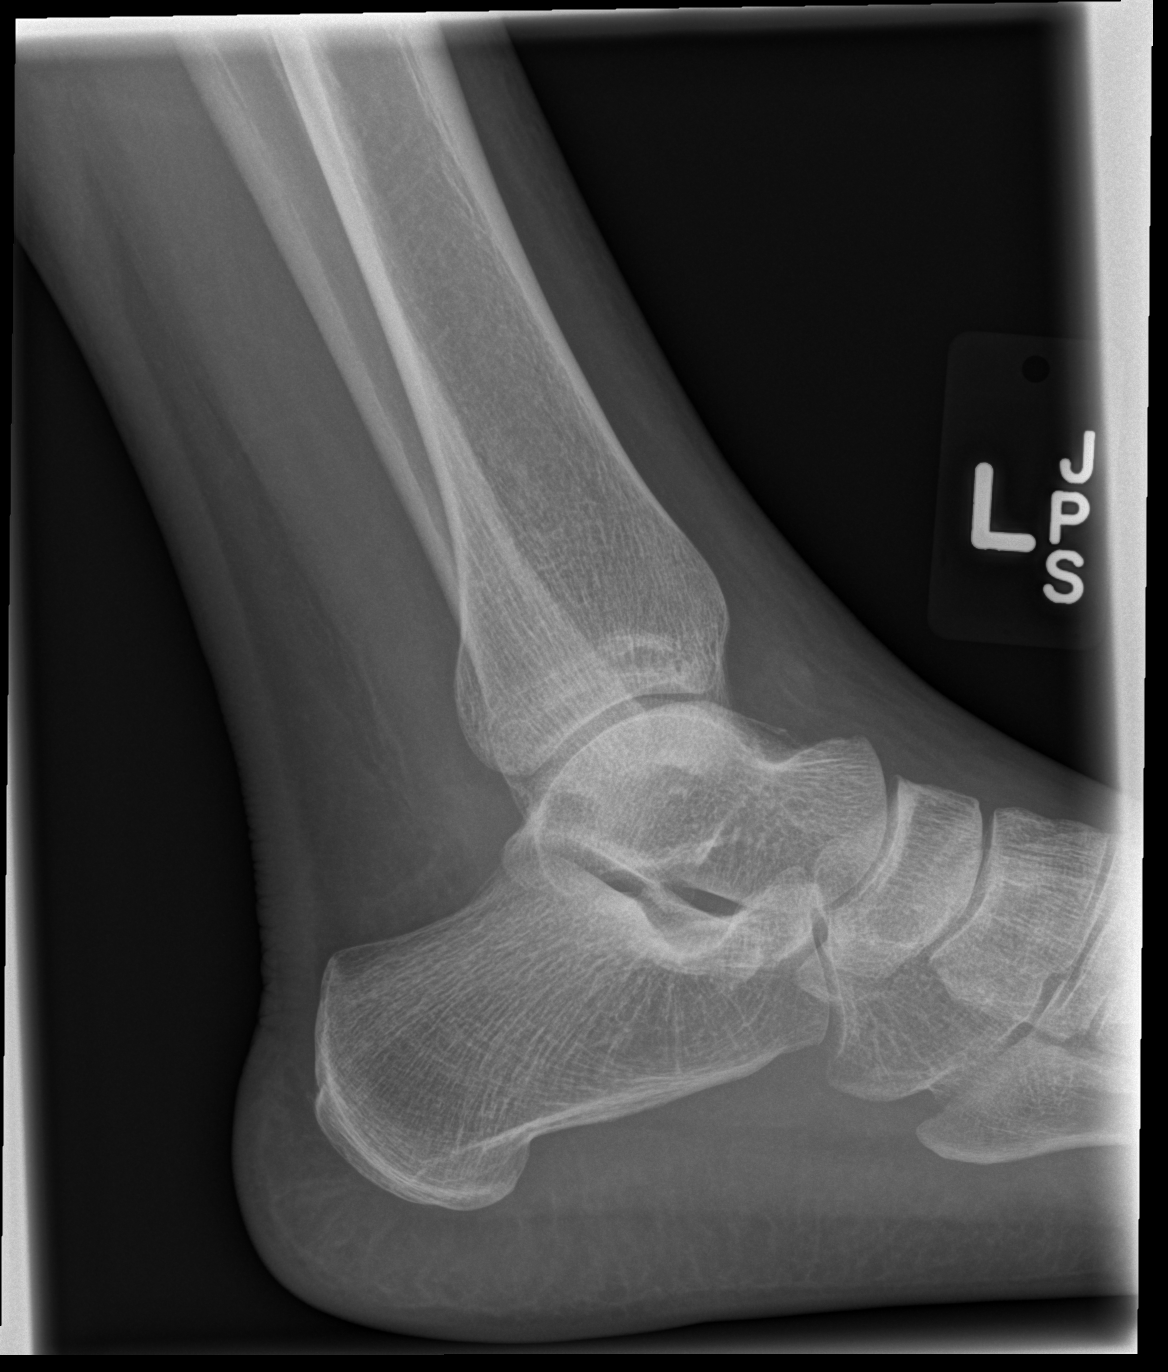

[3 of 3 positions shown; findings below may reference images not displayed]

FINDINGS: There is no evidence of fracture, dislocation, or joint effusion.
There is no evidence of arthropathy or other focal bone abnormality.
Soft tissue swelling.
IMPRESSION: No acute osseous findings.  Soft tissue swelling.

## 2022-07-05 LAB — COLOGUARD: COLOGUARD: NEGATIVE

## 2022-11-15 DIAGNOSIS — I219 Acute myocardial infarction, unspecified: Secondary | ICD-10-CM

## 2022-11-15 HISTORY — DX: Acute myocardial infarction, unspecified: I21.9

## 2023-06-20 ENCOUNTER — Inpatient Hospital Stay (HOSPITAL_COMMUNITY): Payer: 59

## 2023-06-20 ENCOUNTER — Encounter (HOSPITAL_COMMUNITY)
Admission: EM | Disposition: A | Discharge status: Home / Self Care | Payer: Self-pay | Source: Home / Self Care | Attending: Cardiovascular Disease

## 2023-06-20 ENCOUNTER — Inpatient Hospital Stay (HOSPITAL_COMMUNITY)
Admission: EM | Admit: 2023-06-20 | Discharge: 2023-06-23 | DRG: 281 | Disposition: A | Discharge status: Home / Self Care | Payer: 59 | Attending: Cardiovascular Disease | Admitting: Cardiovascular Disease

## 2023-06-20 DIAGNOSIS — Z833 Family history of diabetes mellitus: Secondary | ICD-10-CM | POA: Diagnosis not present

## 2023-06-20 DIAGNOSIS — Z853 Personal history of malignant neoplasm of breast: Secondary | ICD-10-CM

## 2023-06-20 DIAGNOSIS — R000 Tachycardia, unspecified: Secondary | ICD-10-CM | POA: Diagnosis present

## 2023-06-20 DIAGNOSIS — F17210 Nicotine dependence, cigarettes, uncomplicated: Secondary | ICD-10-CM | POA: Diagnosis present

## 2023-06-20 DIAGNOSIS — R079 Chest pain, unspecified: Secondary | ICD-10-CM | POA: Diagnosis not present

## 2023-06-20 DIAGNOSIS — I2119 ST elevation (STEMI) myocardial infarction involving other coronary artery of inferior wall: Secondary | ICD-10-CM

## 2023-06-20 DIAGNOSIS — E118 Type 2 diabetes mellitus with unspecified complications: Secondary | ICD-10-CM | POA: Insufficient documentation

## 2023-06-20 DIAGNOSIS — Z79899 Other long term (current) drug therapy: Secondary | ICD-10-CM | POA: Diagnosis not present

## 2023-06-20 DIAGNOSIS — E785 Hyperlipidemia, unspecified: Secondary | ICD-10-CM | POA: Insufficient documentation

## 2023-06-20 DIAGNOSIS — I2510 Atherosclerotic heart disease of native coronary artery without angina pectoris: Secondary | ICD-10-CM | POA: Diagnosis present

## 2023-06-20 DIAGNOSIS — I241 Dressler's syndrome: Secondary | ICD-10-CM | POA: Diagnosis present

## 2023-06-20 DIAGNOSIS — Z7982 Long term (current) use of aspirin: Secondary | ICD-10-CM

## 2023-06-20 DIAGNOSIS — Z604 Social exclusion and rejection: Secondary | ICD-10-CM | POA: Diagnosis present

## 2023-06-20 DIAGNOSIS — E119 Type 2 diabetes mellitus without complications: Secondary | ICD-10-CM | POA: Diagnosis present

## 2023-06-20 DIAGNOSIS — Z635 Disruption of family by separation and divorce: Secondary | ICD-10-CM

## 2023-06-20 DIAGNOSIS — M069 Rheumatoid arthritis, unspecified: Secondary | ICD-10-CM | POA: Diagnosis present

## 2023-06-20 DIAGNOSIS — I2102 ST elevation (STEMI) myocardial infarction involving left anterior descending coronary artery: Secondary | ICD-10-CM | POA: Diagnosis present

## 2023-06-20 DIAGNOSIS — Z8249 Family history of ischemic heart disease and other diseases of the circulatory system: Secondary | ICD-10-CM

## 2023-06-20 DIAGNOSIS — D72829 Elevated white blood cell count, unspecified: Secondary | ICD-10-CM | POA: Diagnosis present

## 2023-06-20 HISTORY — PX: LEFT HEART CATH AND CORONARY ANGIOGRAPHY: CATH118249

## 2023-06-20 LAB — TROPONIN I (HIGH SENSITIVITY)
Troponin I (High Sensitivity): 5478 ng/L (ref <18)
Troponin I (High Sensitivity): 5782 ng/L (ref <18)

## 2023-06-20 LAB — ECHOCARDIOGRAM COMPLETE
AR max vel: 2.37 cm2
AV Area VTI: 2.2 cm2
AV Area mean vel: 2.33 cm2
AV Mean grad: 3 mmHg
AV Peak grad: 6.5 mmHg
Ao pk vel: 1.27 m/s
Area-P 1/2: 4.36 cm2
Height: 65 in
S' Lateral: 2.8 cm
Weight: 2493.84 oz

## 2023-06-20 LAB — HEMOGLOBIN A1C
Hgb A1c MFr Bld: 5.5 % (ref 4.8–5.6)
Mean Plasma Glucose: 111.15 mg/dL

## 2023-06-20 LAB — POCT ACTIVATED CLOTTING TIME: Activated Clotting Time: 201 seconds

## 2023-06-20 LAB — CG4 I-STAT (LACTIC ACID): Lactic Acid, Venous: 0.4 mmol/L — ABNORMAL LOW (ref 0.5–1.9)

## 2023-06-20 LAB — MRSA NEXT GEN BY PCR, NASAL: MRSA by PCR Next Gen: NOT DETECTED

## 2023-06-20 LAB — SEDIMENTATION RATE: Sed Rate: 33 mm/hr — ABNORMAL HIGH (ref 0–22)

## 2023-06-20 LAB — GLUCOSE, CAPILLARY
Glucose-Capillary: 143 mg/dL — ABNORMAL HIGH (ref 70–99)
Glucose-Capillary: 138 mg/dL — ABNORMAL HIGH (ref 70–99)

## 2023-06-20 SURGERY — LEFT HEART CATH AND CORONARY ANGIOGRAPHY
Anesthesia: LOCAL

## 2023-06-20 MED ORDER — MORPHINE SULFATE (PF) 2 MG/ML IV SOLN
2.0000 mg | INTRAVENOUS | Status: DC | PRN
  Administered 2023-06-20 – 2023-06-22 (×10): 2 mg via INTRAVENOUS
  Filled 2023-06-20 (×11): qty 1

## 2023-06-20 MED ORDER — ROSUVASTATIN CALCIUM 20 MG PO TABS
40.0000 mg | ORAL_TABLET | Freq: Every day | ORAL | Status: DC
  Administered 2023-06-20 – 2023-06-22 (×3): 40 mg via ORAL
  Filled 2023-06-20 (×3): qty 2

## 2023-06-20 MED ORDER — ASPIRIN 81 MG PO TBEC: 81.0000 mg | DELAYED_RELEASE_TABLET | Freq: Every day | ORAL | Status: DC

## 2023-06-20 MED ORDER — SODIUM CHLORIDE 0.9 % IV SOLN: 250.0000 mL | INTRAVENOUS | Status: DC | PRN

## 2023-06-20 MED ORDER — COLCHICINE 0.6 MG PO TABS
0.6000 mg | ORAL_TABLET | Freq: Two times a day (BID) | ORAL | Status: DC
  Administered 2023-06-20 – 2023-06-23 (×6): 0.6 mg via ORAL
  Filled 2023-06-20 (×6): qty 1

## 2023-06-20 MED ORDER — FENTANYL CITRATE (PF) 100 MCG/2ML IJ SOLN
INTRAMUSCULAR | Status: AC
  Filled 2023-06-20: qty 2

## 2023-06-20 MED ORDER — HEPARIN (PORCINE) IN NACL 1000-0.9 UT/500ML-% IV SOLN
INTRAVENOUS | Status: DC | PRN
  Administered 2023-06-20 (×2): 500 mL

## 2023-06-20 MED ORDER — MIDAZOLAM HCL 2 MG/2ML IJ SOLN
INTRAMUSCULAR | Status: AC
  Filled 2023-06-20: qty 2

## 2023-06-20 MED ORDER — ONDANSETRON HCL 4 MG/2ML IJ SOLN: 4.0000 mg | Freq: Four times a day (QID) | INTRAMUSCULAR | Status: DC | PRN

## 2023-06-20 MED ORDER — INSULIN ASPART 100 UNIT/ML IJ SOLN
0.0000 [IU] | Freq: Three times a day (TID) | INTRAMUSCULAR | Status: DC
  Administered 2023-06-20 – 2023-06-22 (×4): 2 [IU] via SUBCUTANEOUS

## 2023-06-20 MED ORDER — ASPIRIN 81 MG PO CHEW: 81.0000 mg | CHEWABLE_TABLET | Freq: Every day | ORAL | Status: DC

## 2023-06-20 MED ORDER — NITROGLYCERIN 0.4 MG SL SUBL
0.4000 mg | SUBLINGUAL_TABLET | SUBLINGUAL | Status: DC | PRN
  Administered 2023-06-20 – 2023-06-22 (×4): 0.4 mg via SUBLINGUAL
  Filled 2023-06-20 (×5): qty 1

## 2023-06-20 MED ORDER — LABETALOL HCL 5 MG/ML IV SOLN: 10.0000 mg | INTRAVENOUS | Status: AC | PRN

## 2023-06-20 MED ORDER — ACETAMINOPHEN 325 MG PO TABS
650.0000 mg | ORAL_TABLET | ORAL | Status: DC | PRN
  Administered 2023-06-21 – 2023-06-22 (×5): 650 mg via ORAL
  Filled 2023-06-20 (×5): qty 2

## 2023-06-20 MED ORDER — ORAL CARE MOUTH RINSE: 15.0000 mL | OROMUCOSAL | Status: DC | PRN

## 2023-06-20 MED ORDER — FENTANYL CITRATE (PF) 100 MCG/2ML IJ SOLN
INTRAMUSCULAR | Status: DC | PRN
  Administered 2023-06-20: 25 ug via INTRAVENOUS

## 2023-06-20 MED ORDER — IOHEXOL 350 MG/ML SOLN
INTRAVENOUS | Status: DC | PRN
  Administered 2023-06-20: 35 mL via INTRA_ARTERIAL

## 2023-06-20 MED ORDER — SODIUM CHLORIDE 0.9% FLUSH
3.0000 mL | Freq: Two times a day (BID) | INTRAVENOUS | Status: DC
  Administered 2023-06-20 – 2023-06-23 (×6): 3 mL via INTRAVENOUS

## 2023-06-20 MED ORDER — HYDRALAZINE HCL 20 MG/ML IJ SOLN: 10.0000 mg | INTRAMUSCULAR | Status: AC | PRN

## 2023-06-20 MED ORDER — ATORVASTATIN CALCIUM 80 MG PO TABS: 80.0000 mg | ORAL_TABLET | Freq: Every day | ORAL | Status: DC

## 2023-06-20 MED ORDER — ASPIRIN 81 MG PO CHEW
81.0000 mg | CHEWABLE_TABLET | Freq: Every day | ORAL | Status: DC
  Administered 2023-06-21 – 2023-06-23 (×3): 81 mg via ORAL
  Filled 2023-06-20 (×3): qty 1

## 2023-06-20 MED ORDER — METOPROLOL TARTRATE 25 MG PO TABS
25.0000 mg | ORAL_TABLET | Freq: Two times a day (BID) | ORAL | Status: DC
  Administered 2023-06-20 – 2023-06-23 (×7): 25 mg via ORAL
  Filled 2023-06-20 (×7): qty 1

## 2023-06-20 MED ORDER — NITROGLYCERIN 1 MG/10 ML FOR IR/CATH LAB
INTRA_ARTERIAL | Status: AC
  Filled 2023-06-20: qty 10

## 2023-06-20 MED ORDER — FAMOTIDINE 20 MG PO TABS
10.0000 mg | ORAL_TABLET | Freq: Every day | ORAL | Status: DC
  Administered 2023-06-20 – 2023-06-23 (×4): 10 mg via ORAL
  Filled 2023-06-20 (×4): qty 1

## 2023-06-20 MED ORDER — SODIUM CHLORIDE 0.9 % IV SOLN: INTRAVENOUS | Status: AC

## 2023-06-20 MED ORDER — SODIUM CHLORIDE 0.9% FLUSH: 3.0000 mL | INTRAVENOUS | Status: DC | PRN

## 2023-06-20 MED ORDER — ACETAMINOPHEN 325 MG PO TABS: 650.0000 mg | ORAL_TABLET | ORAL | Status: DC | PRN

## 2023-06-20 MED ORDER — LIDOCAINE HCL (PF) 1 % IJ SOLN
INTRAMUSCULAR | Status: DC | PRN
  Administered 2023-06-20: 2 mL

## 2023-06-20 MED ORDER — VERAPAMIL HCL 2.5 MG/ML IV SOLN
INTRA_ARTERIAL | Status: DC | PRN
  Administered 2023-06-20: 15 mL via INTRA_ARTERIAL

## 2023-06-20 MED ORDER — MIDAZOLAM HCL 2 MG/2ML IJ SOLN
INTRAMUSCULAR | Status: DC | PRN
  Administered 2023-06-20: .5 mg via INTRAVENOUS

## 2023-06-20 SURGICAL SUPPLY — 12 items
CATH INFINITI AMBI 5FR TG (CATHETERS) IMPLANT
CATH INFINITI JR4 5F (CATHETERS) IMPLANT
DEVICE RAD COMP TR BAND LRG (VASCULAR PRODUCTS) IMPLANT
GLIDESHEATH SLEND A-KIT 6F 22G (SHEATH) IMPLANT
GUIDEWIRE INQWIRE 1.5J.035X260 (WIRE) IMPLANT
INQWIRE 1.5J .035X260CM (WIRE) ×1
KIT HEART LEFT (KITS) ×1 IMPLANT
PACK CARDIAC CATHETERIZATION (CUSTOM PROCEDURE TRAY) ×1 IMPLANT
PROTECTION STATION PRESSURIZED (MISCELLANEOUS) ×1
SET ATX-X65L (MISCELLANEOUS) IMPLANT
STATION PROTECTION PRESSURIZED (MISCELLANEOUS) IMPLANT
WIRE HI TORQ VERSACORE-J 145CM (WIRE) IMPLANT

## 2023-06-20 NOTE — Consult Note (Deleted)
Cardiology Admission History and Physical   Patient ID: LACHAE RHYNES MRN: [id number]; DOB: 01-20-1971   Admission date: 06/20/2023  PCP:  Dolan Amen, FNP   [facility] Providers Cardiologist: [facility]Dr. Allyson Sabal   Chief Complaint: Chest pain  Patient Profile:   [name] is a 52 y.o. female with stage III breast cancer s/p lumpectomy, hyperlipidemia and DM2 who is being seen 06/20/2023 for the evaluation of inferior STEMI.  History of Present Illness:   [name] is a 52 year old female with past medical history of stage III breast cancer s/p lumpectomy, hyperlipidemia and DM2.  She does not have prior cardiac history.  She was in her usual state of health until Thursday, 06/16/2023 when she started having intermittent substernal chest discomfort.  She had recurrent chest discomfort in the morning of 06/20/2023, this time her chest discomfort lasted longer.  This prompted the patient to seek urgent medical attention at [facility] ED.  Initial EKG was concerning for inferior STEMI.  First at bedtime troponin was 7229.  Creatinine 1.21.  Alkaline phosphatase 125, AST 44, ALT 19.  Glucose 129.  CBC showed white blood cell count 18.2, hemoglobin 14.5.  Patient was transferred urgently to [facility] for cardiac catheterization.  Code STEMI was activated.  Patient arrived at [facility] around 12 PM and was taking immediately to the Cath Lab for urgent cardiac catheterization.  Patient so far has received 4000 units IV heparin, 4 mg of morphine and 2 baby aspirin.  On arrival, she was chest pain-free.   Past Medical History:  Diagnosis Date   Cancer [facility])     Past Surgical History:  Procedure Laterality Date   BREAST LUMPECTOMY Left      Medications Prior to Admission: Prior to Admission medications   Medication Sig Start Date End Date Taking? Authorizing Provider  cetirizine (ZYRTEC) 10 MG tablet Take 1 tablet (10 mg total) by mouth daily.  08/21/16   Deborha Payment, PA-C  hydrocortisone cream 1 % Apply to affected area 2 times daily 08/21/16   Deborha Payment, PA-C  ibuprofen (ADVIL,MOTRIN) 800 MG tablet Take 1 tablet (800 mg total) by mouth 3 (three) times daily. 06/12/16   Eber Hong, MD     Allergies:    Allergies  Allergen Reactions   Amoxicillin Hives    Social History:   Social History   Socioeconomic History   Marital status: Legally Separated    Spouse name: Not on file   Number of children: Not on file   Years of education: Not on file   Highest education level: Not on file  Occupational History   Not on file  Tobacco Use   Smoking status: Every Day    Current packs/day: 0.50    Average packs/day: 0.5 packs/day for 10.0 years (5.0 ttl pk-yrs)    Types: Cigarettes   Smokeless tobacco: Never  Substance and Sexual Activity   Alcohol use: No   Drug use: No   Sexual activity: Not on file  Other Topics Concern   Not on file  Social History Narrative   Not on file   Social Determinants of Health   Financial Resource Strain: Low Risk  (06/11/2023)   Received from [facility]   Overall Financial Resource Strain (CARDIA)    Difficulty of Paying Living Expenses: Not very hard  Food Insecurity: No Food Insecurity (06/11/2023)   Received from [facility]   Hunger Vital Sign  Worried About Programme researcher, broadcasting/film/video in the Last Year: Never true    Ran Out of Food in the Last Year: Never true  Transportation Needs: No Transportation Needs (06/11/2023)   Received from [facility] - Transportation    Lack of Transportation (Medical): No    Lack of Transportation (Non-Medical): No  Physical Activity: Insufficiently Active (06/11/2023)   Received from [facility]   Exercise Vital Sign    Days of Exercise per Week: 7 days    Minutes of Exercise per Session: 20 min  Stress: Stress Concern Present (06/11/2023)   Received from [facility] - Occupational  Stress Questionnaire    Feeling of Stress : To some extent  Social Connections: Somewhat Isolated (06/11/2023)   Received from [facility]   Social Network    How would you rate your social network (family, work, friends)?: Restricted participation with some degree of social isolation  Intimate Partner Violence: Not At Risk (06/11/2023)   Received from [facility]   HITS    Over the last 12 months how often did your partner physically hurt you?: 1    Over the last 12 months how often did your partner insult you or talk down to you?: 1    Over the last 12 months how often did your partner threaten you with physical harm?: 1    Over the last 12 months how often did your partner scream or curse at you?: 1    Family History:   The patient's family history includes Diabetes in an other family member; Hypertension in an other family member.    ROS:  Please see the history of present illness.  All other ROS reviewed and negative.     Physical Exam/Data:   Vitals:   06/20/23 1217 06/20/23 1217  SpO2:  100%  Weight: 70.7 kg   Height: 5\' 5"  (1.651 m)    No intake or output data in the 24 hours ending 06/20/23 1221    06/20/2023   12:17 PM 08/21/2016    7:10 PM 06/12/2016    7:25 AM  Last 3 Weights  Weight (lbs) 155 lb 13.8 oz 160 lb 160 lb  Weight (kg) 70.7 kg 72.576 kg 72.576 kg     Body mass index is 25.94 kg/m.  General:  Well nourished, well developed, in no acute distress HEENT: normal Neck: no JVD Vascular: No carotid bruits; Distal pulses 2+ bilaterally   Cardiac:  normal S1, S2; RRR; no murmur  Lungs:  clear to auscultation bilaterally, no wheezing, rhonchi or rales  Abd: soft, nontender, no hepatomegaly  Ext: no edema Musculoskeletal:  No deformities, BUE and BLE strength normal and equal Skin: warm and dry  Neuro:  CNs 2-12 intact, no focal abnormalities noted Psych:  Normal affect    EKG:  The ECG that was done and was personally reviewed and demonstrates sinus  rhythm, 1-2 mm ST elevation in the inferior leads.  Relevant CV Studies:  N/A  Laboratory Data:  High Sensitivity Troponin:  No results for input(s): "TROPONINIHS" in the last 720 hours.    ChemistryNo results for input(s): "NA", "K", "CL", "CO2", "GLUCOSE", "BUN", "CREATININE", "CALCIUM", "MG", "GFRNONAA", "GFRAA", "ANIONGAP" in the last 168 hours.  No results for input(s): "PROT", "ALBUMIN", "AST", "ALT", "ALKPHOS", "BILITOT" in the last 168 hours. Lipids No results for input(s): "CHOL", "TRIG", "HDL", "LABVLDL", "LDLCALC", "CHOLHDL" in the last 168 hours. HematologyNo results for input(s): "WBC", "RBC", "  HGB", "HCT", "MCV", "MCH", "MCHC", "RDW", "PLT" in the last 168 hours. Thyroid No results for input(s): "TSH", "FREET4" in the last 168 hours. BNPNo results for input(s): "BNP", "PROBNP" in the last 168 hours.  DDimer No results for input(s): "DDIMER" in the last 168 hours.   Radiology/Studies:  No results found.   Assessment and Plan:   Inferior STEMI  -Intermittent substernal chest discomfort since last Thursday.  Persistent since this morning.  Initially went to [facility], troponin was elevated at 7229.  EKG showed ST elevation in the inferior leads.  Patient transferred urgently to the [facility].  Chest pain-free on arrival to [facility].  -Patient was taken urgently to the Cath Lab to undergo cardiac catheterization.  -No history of stroke, only time she was transfused with blood was when she was diagnosed with breast cancer and underwent lumpectomy.  Leukocytosis: White blood cell count 18.2 at [facility].  Likely related to inferior STEMI.  Will repeat blood blood cell count tomorrow AM.  History of stage III breast cancer s/p lumpectomy  Rheumatoid arthritis: Followed by rheumatology service.  Hyperlipidemia: On atorvastatin 10 mg daily at home, will increase to 80 mg daily.  DM2: On Trulicity at home.   Risk Assessment/Risk Scores:     TIMI Risk Score for ST  Elevation MI:   The patient's TIMI risk score is 2, which indicates a 2.2% risk of all cause mortality at 30 days.       Code Status: Full Code  Severity of Illness: The appropriate patient status for this patient is INPATIENT. Inpatient status is judged to be reasonable and necessary in order to provide the required intensity of service to ensure the patient's safety. The patient's presenting symptoms, physical exam findings, and initial radiographic and laboratory data in the context of their chronic comorbidities is felt to place them at high risk for further clinical deterioration. Furthermore, it is not anticipated that the patient will be medically stable for discharge from the hospital within 2 midnights of admission.   * I certify that at the point of admission it is my clinical judgment that the patient will require inpatient hospital care spanning beyond 2 midnights from the point of admission due to high intensity of service, high risk for further deterioration and high frequency of surveillance required.*   For questions or updates, please contact [facility] Please consult [email or web address] for contact info under     Ramond Dial, [place]  06/20/2023 12:21 PM

## 2023-06-20 NOTE — Progress Notes (Signed)
   Called by RN with patient continuing to have chest pain, mostly with inspiration. Presented earlier today with STEMI, cath noted distally occluded LAD which was treated medically given her late presentation and minimal LV dysfunction. Reports receiving some morphine earlier. Echo with no pericardial effusion. 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 Will check Sed rate, CRP along with follow BMET/CBC this morning. Will start colchicine 0.6mg  BID this evening.   Janice Coffin, NP-C 06/20/2023, 7:34 PM Pager: [phone]

## 2023-06-20 NOTE — Plan of Care (Signed)
  Problem: Education: Goal: Knowledge of General Education information will improve Description: Including pain rating scale, medication(s)/side effects and non-pharmacologic comfort measures Outcome: Progressing   Problem: Health Behavior/Discharge Planning: Goal: Ability to manage health-related needs will improve Outcome: Progressing   Problem: Clinical Measurements: Goal: Ability to maintain clinical measurements within normal limits will improve Outcome: Progressing Goal: Will remain free from infection Outcome: Progressing Goal: Diagnostic test results will improve Outcome: Progressing Goal: Respiratory complications will improve Outcome: Progressing Goal: Cardiovascular complication will be avoided Outcome: Progressing   Problem: Activity: Goal: Risk for activity intolerance will decrease Outcome: Progressing   Problem: Nutrition: Goal: Adequate nutrition will be maintained Outcome: Progressing   Problem: Coping: Goal: Level of anxiety will decrease Outcome: Progressing   Problem: Elimination: Goal: Will not experience complications related to bowel motility Outcome: Progressing Goal: Will not experience complications related to urinary retention Outcome: Progressing   Problem: Pain Managment: Goal: General experience of comfort will improve Outcome: Progressing   Problem: Safety: Goal: Ability to remain free from injury will improve Outcome: Progressing   Problem: Skin Integrity: Goal: Risk for impaired skin integrity will decrease Outcome: Progressing   Problem: Education: Goal: Understanding of cardiac disease, CV risk reduction, and recovery process will improve Outcome: Progressing Goal: Individualized Educational Video(s) Outcome: Progressing   Problem: Activity: Goal: Ability to tolerate increased activity will improve Outcome: Progressing   Problem: Cardiac: Goal: Ability to achieve and maintain adequate cardiovascular perfusion will  improve Outcome: Progressing   Problem: Health Behavior/Discharge Planning: Goal: Ability to safely manage health-related needs after discharge will improve Outcome: Progressing   Problem: Education: Goal: Ability to describe self-care measures that may prevent or decrease complications (Diabetes Survival Skills Education) will improve Outcome: Progressing Goal: Individualized Educational Video(s) Outcome: Progressing   Problem: Coping: Goal: Ability to adjust to condition or change in health will improve Outcome: Progressing   Problem: Fluid Volume: Goal: Ability to maintain a balanced intake and output will improve Outcome: Progressing   Problem: Health Behavior/Discharge Planning: Goal: Ability to identify and utilize available resources and services will improve Outcome: Progressing Goal: Ability to manage health-related needs will improve Outcome: Progressing   Problem: Metabolic: Goal: Ability to maintain appropriate glucose levels will improve Outcome: Progressing   Problem: Nutritional: Goal: Maintenance of adequate nutrition will improve Outcome: Progressing Goal: Progress toward achieving an optimal weight will improve Outcome: Progressing   Problem: Skin Integrity: Goal: Risk for impaired skin integrity will decrease Outcome: Progressing   Problem: Tissue Perfusion: Goal: Adequacy of tissue perfusion will improve Outcome: Progressing   Problem: Education: Goal: Understanding of CV disease, CV risk reduction, and recovery process will improve Outcome: Progressing Goal: Individualized Educational Video(s) Outcome: Progressing   Problem: Activity: Goal: Ability to return to baseline activity level will improve Outcome: Progressing   Problem: Cardiovascular: Goal: Ability to achieve and maintain adequate cardiovascular perfusion will improve Outcome: Progressing Goal: Vascular access site(s) Level 0-1 will be maintained Outcome: Progressing    Problem: Health Behavior/Discharge Planning: Goal: Ability to safely manage health-related needs after discharge will improve Outcome: Progressing

## 2023-06-20 NOTE — H&P (Addendum)
Cardiology Admission History and Physical    Patient ID: [name] MRN: [id number]; DOB: 07-Oct-1971    Admission date: 06/20/2023   PCP:  Dolan Amen, FNP              [facility] Providers Cardiologist: New to [facility] -Dr. Allyson Sabal     Chief Complaint: Chest pain   Patient Profile:    [name] is a 52 y.o. female with stage III breast cancer s/p lumpectomy, hyperlipidemia and DM2 who is being seen 06/20/2023 for the evaluation of inferior STEMI.   History of Present Illness:    Ms. [name] is a 52 year old female with past medical history of stage III breast cancer s/p lumpectomy, hyperlipidemia and DM2.  She does not have prior cardiac history.  She was in her usual state of health until Thursday, 06/16/2023 when she started having intermittent substernal chest discomfort.  She had recurrent chest discomfort in the morning of 06/20/2023, this time her chest discomfort lasted longer.  This prompted the patient to seek urgent medical attention at [facility] ED.  Initial EKG was concerning for inferior STEMI.  First at bedtime troponin was 7229.  Creatinine 1.21.  Alkaline phosphatase 125, AST 44, ALT 19.  Glucose 129.  CBC showed white blood cell count 18.2, hemoglobin 14.5.  Patient was transferred urgently to [facility] for cardiac catheterization.  Code STEMI was activated.  Patient arrived at [facility] around 12 PM and was taking immediately to the Cath Lab for urgent cardiac catheterization.  Patient so far has received 4000 units IV heparin, 4 mg of morphine and 2 baby aspirin.  On arrival, she was chest pain-free.         Past Medical History:  Diagnosis Date   Cancer [facility])                 Past Surgical History:  Procedure Laterality Date   BREAST LUMPECTOMY Left            Medications Prior to Admission:        Prior to Admission medications   Medication Sig Start Date End Date Taking? Authorizing Provider  cetirizine  (ZYRTEC) 10 MG tablet Take 1 tablet (10 mg total) by mouth daily. 08/21/16     Deborha Payment, PA-C  hydrocortisone cream 1 % Apply to affected area 2 times daily 08/21/16     Deborha Payment, PA-C  ibuprofen (ADVIL,MOTRIN) 800 MG tablet Take 1 tablet (800 mg total) by mouth 3 (three) times daily. 06/12/16     Eber Hong, MD      Allergies:    Allergies      Allergies  Allergen Reactions   Amoxicillin Hives        Social History:   Social History         Socioeconomic History   Marital status: Legally Separated      Spouse name: Not on file   Number of children: Not on file   Years of education: Not on file   Highest education level: Not on file  Occupational History   Not on file  Tobacco Use   Smoking status: Every Day      Current packs/day: 0.50      Average packs/day: 0.5 packs/day for 10.0 years (5.0 ttl pk-yrs)      Types: Cigarettes   Smokeless tobacco: Never  Substance and Sexual Activity   Alcohol use: No   Drug use: No  Sexual activity: Not on file  Other Topics Concern   Not on file  Social History Narrative   Not on file    Social Determinants of Health        Financial Resource Strain: Low Risk  (06/11/2023)    Received from [facility]    Overall Financial Resource Strain (CARDIA)     Difficulty of Paying Living Expenses: Not very hard  Food Insecurity: No Food Insecurity (06/11/2023)    Received from [facility]    Hunger Vital Sign     Worried About Running Out of Food in the Last Year: Never true     Ran Out of Food in the Last Year: Never true  Transportation Needs: No Transportation Needs (06/11/2023)    Received from [facility] - Transportation     Lack of Transportation (Medical): No     Lack of Transportation (Non-Medical): No  Physical Activity: Insufficiently Active (06/11/2023)    Received from [facility]    Exercise Vital Sign     Days of Exercise per Week: 7 days     Minutes of Exercise per Session: 20 min   Stress: Stress Concern Present (06/11/2023)    Received from [facility] - Occupational Stress Questionnaire     Feeling of Stress : To some extent  Social Connections: Somewhat Isolated (06/11/2023)    Received from [facility]    Social Network     How would you rate your social network (family, work, friends)?: Restricted participation with some degree of social isolation  Intimate Partner Violence: Not At Risk (06/11/2023)    Received from [facility]    HITS     Over the last 12 months how often did your partner physically hurt you?: 1     Over the last 12 months how often did your partner insult you or talk down to you?: 1     Over the last 12 months how often did your partner threaten you with physical harm?: 1     Over the last 12 months how often did your partner scream or curse at you?: 1    Family History:   The patient's family history includes Diabetes in an other family member; Hypertension in an other family member.     ROS:  Please see the history of present illness.  All other ROS reviewed and negative.      Physical Exam/Data:        Vitals:    06/20/23 1217 06/20/23 1217  SpO2:   100%  Weight: 70.7 kg    Height: 5\' 5"  (1.651 m)      No intake or output data in the 24 hours ending 06/20/23 1221     06/20/2023   12:17 PM 08/21/2016    7:10 PM 06/12/2016    7:25 AM  Last 3 Weights  Weight (lbs) 155 lb 13.8 oz 160 lb 160 lb  Weight (kg) 70.7 kg 72.576 kg 72.576 kg     Body mass index is 25.94 kg/m.  General:  Well nourished, well developed, in no acute distress HEENT: normal Neck: no JVD Vascular: No carotid bruits; Distal pulses 2+ bilaterally   Cardiac:  normal S1, S2; RRR; no murmur  Lungs:  clear to auscultation bilaterally, no wheezing, rhonchi or rales  Abd: soft, nontender, no hepatomegaly  Ext: no edema Musculoskeletal:  No deformities, BUE and BLE strength normal and equal Skin: warm  and dry   Neuro:  CNs 2-12 intact, no focal abnormalities noted Psych:  Normal affect      EKG:  The ECG that was done and was personally reviewed and demonstrates sinus rhythm, 1-2 mm ST elevation in the inferior leads.   Relevant CV Studies:   N/A   Laboratory Data:   High Sensitivity Troponin:   Last Labs  No results for input(s): "TROPONINIHS" in the last 720 hours.      Chemistry Last Labs  No results for input(s): "NA", "K", "CL", "CO2", "GLUCOSE", "BUN", "CREATININE", "CALCIUM", "MG", "GFRNONAA", "GFRAA", "ANIONGAP" in the last 168 hours.    Last Labs  No results for input(s): "PROT", "ALBUMIN", "AST", "ALT", "ALKPHOS", "BILITOT" in the last 168 hours.   Lipids  Last Labs  No results for input(s): "CHOL", "TRIG", "HDL", "LABVLDL", "LDLCALC", "CHOLHDL" in the last 168 hours.   Hematology Last Labs  No results for input(s): "WBC", "RBC", "HGB", "HCT", "MCV", "MCH", "MCHC", "RDW", "PLT" in the last 168 hours.   Thyroid  Last Labs  No results for input(s): "TSH", "FREET4" in the last 168 hours.   BNP Last Labs  No results for input(s): "BNP", "PROBNP" in the last 168 hours.    DDimer  Last Labs  No results for input(s): "DDIMER" in the last 168 hours.       Radiology/Studies:  No results found.     Assessment and Plan:    Inferior STEMI             -Intermittent substernal chest discomfort since last Thursday.  Persistent since this morning.  Initially went to [facility], troponin was elevated at 7229.  EKG showed ST elevation in the inferior leads.  Patient transferred urgently to the [facility].  Chest pain-free on arrival to [facility].             -Patient was taken urgently to the Cath Lab to undergo cardiac catheterization.             -No history of stroke, only time she was transfused with blood was when she was diagnosed with breast cancer and underwent lumpectomy.   Leukocytosis: White blood cell count 18.2 at [facility].  Likely  related to inferior STEMI.  Will repeat blood blood cell count tomorrow AM.   History of stage III breast cancer s/p lumpectomy   Rheumatoid arthritis: Followed by rheumatology service.   Hyperlipidemia: On atorvastatin 10 mg daily at home, will increase to 80 mg daily.   DM2: On Trulicity at home.     Risk Assessment/Risk Scores:    TIMI Risk Score for ST  Elevation MI:   The patient's TIMI risk score is 2, which indicates a 2.2% risk of all cause mortality at 30 days.        Code Status: Full Code   Severity of Illness: The appropriate patient status for this patient is INPATIENT. Inpatient status is judged to be reasonable and necessary in order to provide the required intensity of service to ensure the patient's safety. The patient's presenting symptoms, physical exam findings, and initial radiographic and laboratory data in the context of their chronic comorbidities is felt to place them at high risk for further clinical deterioration. Furthermore, it is not anticipated that the patient will be medically stable for discharge from the hospital within 2 midnights of admission.    * I certify that at the point of admission it is my clinical judgment that the patient will  require inpatient hospital care spanning beyond 2 midnights from the point of admission due to high intensity of service, high risk for further deterioration and high frequency of surveillance required.*    For questions or updates, please contact [facility] Please consult [email or web address] for contact info under      Signed, Azalee Course, [place]  06/20/2023 12:21 PM     Agree with note by Azalee Course PA-C  Ms. Montreuil is a 52 year old divorced African-American female with a history of hyperlipidemia and diabetes as well as family history who has been having waxing waning chest pain since Thursday which became more persistent this morning.  She was seen at [facility] where she was found to have  inferolateral ST segment elevation and she was transported to [facility] for invasive evaluation.  Upon arrival she was pain-free.  EKG showed similar changes.  She was hemodynamically stable.  She was brought to the Cath Lab for urgent cardiac catheterization.  Her exam was benign.  Runell Gess, M.D., FACP, [facility], Earl Lagos [facility] [address] [place], [place]  [address]  [phone] 06/20/2023 12:58 PM

## 2023-06-21 ENCOUNTER — Encounter (HOSPITAL_COMMUNITY): Payer: Self-pay | Admitting: Cardiovascular Disease

## 2023-06-21 ENCOUNTER — Other Ambulatory Visit (HOSPITAL_COMMUNITY): Payer: Self-pay

## 2023-06-21 DIAGNOSIS — I2510 Atherosclerotic heart disease of native coronary artery without angina pectoris: Secondary | ICD-10-CM | POA: Diagnosis not present

## 2023-06-21 LAB — GLUCOSE, CAPILLARY
Glucose-Capillary: 124 mg/dL — ABNORMAL HIGH (ref 70–99)
Glucose-Capillary: 94 mg/dL (ref 70–99)
Glucose-Capillary: 78 mg/dL (ref 70–99)
Glucose-Capillary: 100 mg/dL — ABNORMAL HIGH (ref 70–99)

## 2023-06-21 LAB — LACTIC ACID, PLASMA: Lactic Acid, Venous: 0.4 mmol/L — ABNORMAL LOW (ref 0.5–1.9)

## 2023-06-21 MED ORDER — POTASSIUM CHLORIDE CRYS ER 20 MEQ PO TBCR
40.0000 meq | EXTENDED_RELEASE_TABLET | ORAL | Status: AC
  Administered 2023-06-21 (×2): 40 meq via ORAL
  Filled 2023-06-21 (×2): qty 2

## 2023-06-21 MED ORDER — CHLORHEXIDINE GLUCONATE CLOTH 2 % EX PADS
6.0000 | MEDICATED_PAD | Freq: Every day | CUTANEOUS | Status: DC
  Administered 2023-06-21 – 2023-06-23 (×3): 6 via TOPICAL

## 2023-06-21 NOTE — TOC Benefit Eligibility Note (Signed)
Patient Product/process development scientist completed.    The patient is insured through [facility] Medicare Part D  Ran test claim for Colchicine 0.6 mg and the current 30 day co-pay is $0.00.   This test claim was processed through [facility]- copay amounts may vary at other pharmacies due to pharmacy/plan contracts, or as the patient moves through the different stages of their insurance plan.     Roland Earl, CPHT Pharmacy Patient Advocate Specialist [facility] Patient Advocate Team Direct Number: [phone]  Fax: [phone]

## 2023-06-21 NOTE — TOC Initial Note (Signed)
Transition of Care [facility]) - Initial/Assessment Note    Patient Details  Name: [name] MRN: [id number] Date of Birth: 12-25-1970  Transition of Care [facility]) CM/SW Contact:    Elliot Cousin, RN Phone Number: [phone] 06/21/2023, 9:40 AM  Clinical Narrative:  CM spoke to pt at bedside. States she was independent PTA. States she drives to appts. Her dtr and mother available to assist with care as needed.                  Expected Discharge Plan: Home/Self Care Barriers to Discharge: Continued Medical Work up   Patient Goals and CMS Choice Patient states their goals for this hospitalization and ongoing recovery are:: wants to remain independent    Expected Discharge Plan and Services   Discharge Planning Services: CM Consult   Living arrangements for the past 2 months: Single Family Home                    Prior Living Arrangements/Services Living arrangements for the past 2 months: Single Family Home Lives with:: Adult Children, Parents Patient language and need for interpreter reviewed:: Yes Do you feel safe going back to the place where you live?: Yes      Need for Family Participation in Patient Care: No (Comment) Care giver support system in place?: Yes (comment)   Criminal Activity/Legal Involvement Pertinent to Current Situation/Hospitalization: No - Comment as needed  Activities of Daily Living      Permission Sought/Granted Permission sought to share information with : Case Manager, Family Supports, PCP Permission granted to share information with : Yes, Verbal Permission Granted  Share Information with NAME: Kioni Franklyn     Permission granted to share info w Relationship: mother  Permission granted to share info w Contact Information: [phone]  Emotional Assessment Appearance:: Appears stated age Attitude/Demeanor/Rapport: Engaged Affect (typically observed): Accepting Orientation: : Oriented to Self, Oriented to Place, Oriented to  Time,  Oriented to Situation   Psych Involvement: No (comment)  Admission diagnosis:  Acute ST elevation myocardial infarction (STEMI) of inferior wall (HCC) [I21.19] 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 [place] [address] Phone: [phone] Fax: [phone]     Social Determinants of Health (SDOH) Social History: SDOH Screenings   Food Insecurity: No Food Insecurity (06/11/2023)   Received from [facility]  Transportation Needs: No Transportation Needs (06/11/2023)   Received from [facility]  Utilities: Not At Risk (06/11/2023)   Received from [facility]  Financial Resource Strain: Low Risk  (06/11/2023)   Received from [facility]  Physical Activity: Insufficiently Active (06/11/2023)   Received from [facility]  Social Connections: Somewhat Isolated (06/11/2023)   Received from [facility]  Stress: Stress Concern Present (06/11/2023)   Received from [facility]  Tobacco Use: High Risk (06/21/2023)  Health Literacy: Medium Risk (10/28/2021)   Received from [facility]   SDOH Interventions:     Readmission Risk Interventions  No data to display

## 2023-06-21 NOTE — Plan of Care (Signed)
  Problem: Education: Goal: Knowledge of General Education information will improve Description: Including pain rating scale, medication(s)/side effects and non-pharmacologic comfort measures Outcome: Progressing   Problem: Health Behavior/Discharge Planning: Goal: Ability to manage health-related needs will improve Outcome: Progressing   Problem: Clinical Measurements: Goal: Ability to maintain clinical measurements within normal limits will improve Outcome: Progressing Goal: Will remain free from infection Outcome: Progressing Goal: Diagnostic test results will improve Outcome: Progressing Goal: Respiratory complications will improve Outcome: Progressing Goal: Cardiovascular complication will be avoided Outcome: Progressing   Problem: Activity: Goal: Risk for activity intolerance will decrease Outcome: Progressing   Problem: Nutrition: Goal: Adequate nutrition will be maintained Outcome: Progressing   Problem: Coping: Goal: Level of anxiety will decrease Outcome: Progressing   Problem: Elimination: Goal: Will not experience complications related to bowel motility Outcome: Progressing Goal: Will not experience complications related to urinary retention Outcome: Progressing   Problem: Pain Managment: Goal: General experience of comfort will improve Outcome: Progressing   Problem: Safety: Goal: Ability to remain free from injury will improve Outcome: Progressing   Problem: Skin Integrity: Goal: Risk for impaired skin integrity will decrease Outcome: Progressing   Problem: Education: Goal: Understanding of cardiac disease, CV risk reduction, and recovery process will improve Outcome: Progressing Goal: Individualized Educational Video(s) Outcome: Progressing   Problem: Activity: Goal: Ability to tolerate increased activity will improve Outcome: Progressing   Problem: Cardiac: Goal: Ability to achieve and maintain adequate cardiovascular perfusion will  improve Outcome: Progressing   Problem: Health Behavior/Discharge Planning: Goal: Ability to safely manage health-related needs after discharge will improve Outcome: Progressing   Problem: Education: Goal: Ability to describe self-care measures that may prevent or decrease complications (Diabetes Survival Skills Education) will improve Outcome: Progressing Goal: Individualized Educational Video(s) Outcome: Progressing   Problem: Coping: Goal: Ability to adjust to condition or change in health will improve Outcome: Progressing   Problem: Fluid Volume: Goal: Ability to maintain a balanced intake and output will improve Outcome: Progressing   Problem: Health Behavior/Discharge Planning: Goal: Ability to identify and utilize available resources and services will improve Outcome: Progressing Goal: Ability to manage health-related needs will improve Outcome: Progressing   Problem: Metabolic: Goal: Ability to maintain appropriate glucose levels will improve Outcome: Progressing   Problem: Nutritional: Goal: Maintenance of adequate nutrition will improve Outcome: Progressing Goal: Progress toward achieving an optimal weight will improve Outcome: Progressing   Problem: Skin Integrity: Goal: Risk for impaired skin integrity will decrease Outcome: Progressing   Problem: Tissue Perfusion: Goal: Adequacy of tissue perfusion will improve Outcome: Progressing   Problem: Education: Goal: Understanding of CV disease, CV risk reduction, and recovery process will improve Outcome: Progressing Goal: Individualized Educational Video(s) Outcome: Progressing   Problem: Activity: Goal: Ability to return to baseline activity level will improve Outcome: Progressing   Problem: Cardiovascular: Goal: Ability to achieve and maintain adequate cardiovascular perfusion will improve Outcome: Progressing Goal: Vascular access site(s) Level 0-1 will be maintained Outcome: Progressing    Problem: Health Behavior/Discharge Planning: Goal: Ability to safely manage health-related needs after discharge will improve Outcome: Progressing

## 2023-06-21 NOTE — Progress Notes (Signed)
Rounding Note    Patient Name: [name] Date of Encounter: 06/21/2023  [facility] Cardiologist: Dr. Nanetta Batty  Subjective   Postop day 1 anterior/inferior STEMI treated conservatively.  Patient has some pleuritic chest pain but otherwise is asymptomatic.  Inpatient Medications    Scheduled Meds:  aspirin  81 mg Oral Daily   Chlorhexidine Gluconate Cloth  6 each Topical Daily   colchicine  0.6 mg Oral BID   famotidine  10 mg Oral Daily   insulin aspart  0-15 Units Subcutaneous TID WC   metoprolol tartrate  25 mg Oral BID   potassium chloride  40 mEq Oral Q4H   rosuvastatin  40 mg Oral q1800   sodium chloride flush  3 mL Intravenous Q12H   Continuous Infusions:  sodium chloride     PRN Meds: sodium chloride, acetaminophen, morphine injection, nitroGLYCERIN, ondansetron (ZOFRAN) IV, mouth rinse, sodium chloride flush   Vital Signs    Vitals:   06/21/23 0600 06/21/23 0640 06/21/23 0700 06/21/23 0720  BP: 128/62  (!) 145/79   Pulse: 84  89 86  Resp: 15  20 18   Temp:  98.1 F (36.7 C)    TempSrc:  Oral    SpO2: 92%  93% 94%  Weight:      Height:        Intake/Output Summary (Last 24 hours) at 06/21/2023 0908 Last data filed at 06/20/2023 2115 Gross per 24 hour  Intake 240 ml  Output 550 ml  Net -310 ml      06/21/2023    2:38 AM 06/20/2023   12:17 PM 08/21/2016    7:10 PM  Last 3 Weights  Weight (lbs) 142 lb 13.7 oz 155 lb 13.8 oz 160 lb  Weight (kg) 64.8 kg 70.7 kg 72.576 kg      Telemetry    Sinus rhythm- Personally Reviewed  ECG    Sinus rhythm at 61 with inferior and anterolateral ST segment elevation that persists.- Personally Reviewed  Physical Exam   GEN: No acute distress.   Neck: No JVD Cardiac: RRR, no murmurs, rubs, or gallops.  Respiratory: Clear to auscultation bilaterally. GI: Soft, nontender, non-distended  MS: No edema; No deformity. Neuro:  Nonfocal  Psych: Normal affect   Labs    High Sensitivity  Troponin:   Recent Labs  Lab 06/20/23 1331 06/20/23 1553  TROPONINIHS 5,478* 5,782*     Chemistry Recent Labs  Lab 06/21/23 0541  NA 132*  K 3.5  CL 100  CO2 20*  GLUCOSE 113*  BUN 10  CREATININE 1.28*  CALCIUM 8.6*  PROT 7.0  ALBUMIN 3.3*  AST 21  ALT 15  ALKPHOS 86  BILITOT 0.6  GFRNONAA 51*  ANIONGAP 12    Lipids  Recent Labs  Lab 06/21/23 0541  CHOL 178  TRIG 98  HDL 36*  LDLCALC 122*  CHOLHDL 4.9    Hematology Recent Labs  Lab 06/21/23 0541  WBC 14.9*  RBC 4.30  HGB 13.1  HCT 39.9  MCV 92.8  MCH 30.5  MCHC 32.8  RDW 14.9  PLT 273   Thyroid No results for input(s): "TSH", "FREET4" in the last 168 hours.  BNPNo results for input(s): "BNP", "PROBNP" in the last 168 hours.  DDimer No results for input(s): "DDIMER" in the last 168 hours.   Radiology    ECHOCARDIOGRAM COMPLETE  Result Date: 06/20/2023    ECHOCARDIOGRAM REPORT   Patient Name:   [name] Date of Exam:  06/20/2023 Medical Rec #:  [id number]         Height:       65.0 in Accession #:    [id number]        Weight:       155.9 lb Date of Birth:  1971-05-10          BSA:          1.779 m Patient Age:    52 years          BP:           137/100 mmHg Patient Gender: F                 HR:           75 bpm. Exam Location:  Inpatient Procedure: 2D Echo, Color Doppler and Cardiac Doppler Indications:    STEMI  History:        Patient has no prior history of Echocardiogram examinations.                 Acute MI and Acute Inferior STEMI; Risk Factors:Diabetes and                 Dyslipidemia.  Sonographer:    Milbert Coulter Referring Phys: [id number] HAO MENG IMPRESSIONS  1. Apex is hypokinetic in the setting of distal LAD STEMI. Left ventricular ejection fraction, by estimation, is 55 to 60%. The left ventricle has normal function. There is mild left ventricular hypertrophy. Left ventricular diastolic parameters are consistent with Grade I diastolic dysfunction (impaired relaxation).  2. Right ventricular  systolic function is normal. The right ventricular size is normal. There is normal pulmonary artery systolic pressure.  3. The mitral valve is normal in structure. No evidence of mitral valve regurgitation.  4. The aortic valve is tricuspid. Aortic valve regurgitation is not visualized.  5. The inferior vena cava is normal in size with greater than 50% respiratory variability, suggesting right atrial pressure of 3 mmHg. FINDINGS  Left Ventricle: Apex is hypokinetic in the setting of distal LAD STEMI. Left ventricular ejection fraction, by estimation, is 55 to 60%. The left ventricle has normal function. The left ventricular internal cavity size was normal in size. There is mild left ventricular hypertrophy. Left ventricular diastolic parameters are consistent with Grade I diastolic dysfunction (impaired relaxation). Right Ventricle: The right ventricular size is normal. Right ventricular systolic function is normal. There is normal pulmonary artery systolic pressure. The tricuspid regurgitant velocity is 2.28 m/s, and with an assumed right atrial pressure of 3 mmHg,  the estimated right ventricular systolic pressure is 23.8 mmHg. Left Atrium: Left atrial size was normal in size. Right Atrium: Right atrial size was normal in size. Pericardium: There is no evidence of pericardial effusion. Mitral Valve: The mitral valve is normal in structure. No evidence of mitral valve regurgitation. Tricuspid Valve: The tricuspid valve is normal in structure. Tricuspid valve regurgitation is mild. Aortic Valve: The aortic valve is tricuspid. Aortic valve regurgitation is not visualized. Aortic valve mean gradient measures 3.0 mmHg. Aortic valve peak gradient measures 6.5 mmHg. Aortic valve area, by VTI measures 2.20 cm. Pulmonic Valve: The pulmonic valve was normal in structure. Pulmonic valve regurgitation is not visualized. Aorta: The aortic root and ascending aorta are structurally normal, with no evidence of dilitation.  Venous: The inferior vena cava is normal in size with greater than 50% respiratory variability, suggesting right atrial pressure of 3 mmHg. IAS/Shunts: The interatrial septum was not well visualized.  LEFT VENTRICLE PLAX 2D LVIDd:         4.30 cm   Diastology LVIDs:         2.80 cm   LV e' medial:    8.38 cm/s LV PW:         0.90 cm   LV E/e' medial:  8.1 LV IVS:        0.90 cm   LV e' lateral:   10.10 cm/s LVOT diam:     1.90 cm   LV E/e' lateral: 6.7 LV SV:         53 LV SV Index:   30 LVOT Area:     2.84 cm  RIGHT VENTRICLE RV Basal diam:  3.00 cm RV Mid diam:    2.20 cm RV S prime:     15.20 cm/s TAPSE (M-mode): 2.1 cm LEFT ATRIUM             Index        RIGHT ATRIUM          Index LA diam:        3.00 cm 1.69 cm/m   RA Area:     9.59 cm LA Vol (A2C):   29.0 ml 16.30 ml/m  RA Volume:   20.10 ml 11.30 ml/m LA Vol (A4C):   22.2 ml 12.48 ml/m LA Biplane Vol: 25.8 ml 14.50 ml/m  AORTIC VALVE AV Area (Vmax):    2.37 cm AV Area (Vmean):   2.33 cm AV Area (VTI):     2.20 cm AV Vmax:           127.00 cm/s AV Vmean:          82.700 cm/s AV VTI:            0.241 m AV Peak Grad:      6.5 mmHg AV Mean Grad:      3.0 mmHg LVOT Vmax:         106.00 cm/s LVOT Vmean:        68.100 cm/s LVOT VTI:          0.187 m LVOT/AV VTI ratio: 0.78  AORTA Ao Root diam: 2.50 cm MITRAL VALVE               TRICUSPID VALVE MV Area (PHT): 4.36 cm    TR Peak grad:   20.8 mmHg MV Decel Time: 174 msec    TR Vmax:        228.00 cm/s MV E velocity: 67.60 cm/s MV A velocity: 72.80 cm/s  SHUNTS MV E/A ratio:  0.93        Systemic VTI:  0.19 m                            Systemic Diam: 1.90 cm Carolan Clines Electronically signed by Carolan Clines Signature Date/Time: 06/20/2023/2:44:47 PM    Final    CARDIAC CATHETERIZATION  Result Date: 06/20/2023 Images from the original result were not included.   Dist LAD lesion is 100% stenosed.   There is mild left ventricular systolic dysfunction.   LV end diastolic pressure is normal.   The left  ventricular ejection fraction is 50-55% by visual estimate. [name] is a 52 y.o. female  [id number] LOCATION:  FACILITY: [facility] PHYSICIAN: Nanetta Batty, M.D. Aug 04, 1971 DATE OF PROCEDURE:  06/20/2023 DATE OF DISCHARGE: CARDIAC CATHETERIZATION History obtained from chart review.  [name] is a 52 year old divorced African-American female mother  of one 18 year old daughter with history of diabetes, tobacco abuse and family history who has had waxing and waning chest pain since Thursday.  It became more persistent this morning.  She went to [facility] where her EKG showed inferolateral ST segment elevation.  She was transported to [facility] for urgent angiography.   [name] had a distal apical LAD occlusion.  Is unclear whether this is atherosclerotic or SCAD .  She has no atherosclerosis in the remainder of her coronary circulation.  Her EF is in the 50% range with apical akinesis.  I elected not to intervene and suspect that she will complete her infarct with minimal LV dysfunction.  She will need to be on aspirin, high-dose statin.  We discussed the importance of smoking cessation.  The sheath was removed and a TR band was placed on the right wrist to achieve patent hemostasis.  The patient left lab in stable condition. Nanetta Batty. MD, [facility] 06/20/2023 12:51 PM     Cardiac Studies   Cardiac catheterization (06/20/2023)  History obtained from chart review.  [name] is a 52 year old divorced African-American female mother of one 26 year old daughter with history of diabetes, tobacco abuse and family history who has had waxing and waning chest pain since Thursday.  It became more persistent this morning.  She went to [facility] where her EKG showed inferolateral ST segment elevation.  She was transported to [facility] for urgent angiography.     IMPRESSION: [name] had a distal apical LAD occlusion.  Is unclear whether this is atherosclerotic or SCAD .  She has no  atherosclerosis in the remainder of her coronary circulation.  Her EF is in the 50% range with apical akinesis.  I elected not to intervene and suspect that she will complete her infarct with minimal LV dysfunction.  She will need to be on aspirin, high-dose statin.  We discussed the importance of smoking cessation.  The sheath was removed and a TR band was placed on the right wrist to achieve patent hemostasis.  The patient left lab in stable condition.   Nanetta Batty. MD, [facility]  onary Diagrams  Diagnostic Dominance: Right     2D echocardiogram (06/20/2023)  IMPRESSIONS     1. Apex is hypokinetic in the setting of distal LAD STEMI. Left  ventricular ejection fraction, by estimation, is 55 to 60%. The left  ventricle has normal function. There is mild left ventricular hypertrophy.  Left ventricular diastolic parameters are  consistent with Grade I diastolic dysfunction (impaired relaxation).   2. Right ventricular systolic function is normal. The right ventricular  size is normal. There is normal pulmonary artery systolic pressure.   3. The mitral valve is normal in structure. No evidence of mitral valve  regurgitation.   4. The aortic valve is tricuspid. Aortic valve regurgitation is not  visualized.   5. The inferior vena cava is normal in size with greater than 50%  respiratory variability, suggesting right atrial pressure of 3 mmHg.   Patient Profile     [name] is a 52 y.o. female with stage III breast cancer s/p lumpectomy, hyperlipidemia and DM2 who is being seen 06/20/2023 for the evaluation of inferior STEMI.   Assessment & Plan    1: Inferior and anterolateral STEMI-peak troponins increased to 5000.  EKG shows continued ST segment elevation in inferior and anterolateral leads.  Cath showed distal LAD occlusion unclear whether this was atherosclerotic or "scad".  She does have pleuritic  chest pain and was treated with colchicine.  2D echo revealed normal LV  function with an apical wall motion abnormality.  2: Hyperlipidemia-on low-dose atorvastatin at home.  LDL was 122.  Transition to high-dose Crestor.  3: Type 2 diabetes-on Trulicity at home, sliding scale here.  Postop day 1 inferior anion anterior STEMI with cath showing apical LAD lesion.  Stable for transfer to telemetry.  Ambulate today and probably home tomorrow.  For questions or updates, please contact [facility] Please consult [email or web address] for contact info under        Signed, Nanetta Batty, MD  06/21/2023, 9:08 AM

## 2023-06-22 DIAGNOSIS — I2119 ST elevation (STEMI) myocardial infarction involving other coronary artery of inferior wall: Secondary | ICD-10-CM

## 2023-06-22 LAB — BASIC METABOLIC PANEL
Anion gap: 15 (ref 5–15)
BUN: 13 mg/dL (ref 6–20)
CO2: 19 mmol/L — ABNORMAL LOW (ref 22–32)
Calcium: 9 mg/dL (ref 8.9–10.3)
Chloride: 97 mmol/L — ABNORMAL LOW (ref 98–111)
Creatinine, Ser: 1.35 mg/dL — ABNORMAL HIGH (ref 0.44–1.00)
GFR, Estimated: 48 mL/min — ABNORMAL LOW (ref >60)
Glucose, Bld: 189 mg/dL — ABNORMAL HIGH (ref 70–99)
Potassium: 4.6 mmol/L (ref 3.5–5.1)
Sodium: 131 mmol/L — ABNORMAL LOW (ref 135–145)

## 2023-06-22 LAB — GLUCOSE, CAPILLARY
Glucose-Capillary: 138 mg/dL — ABNORMAL HIGH (ref 70–99)
Glucose-Capillary: 123 mg/dL — ABNORMAL HIGH (ref 70–99)
Glucose-Capillary: 110 mg/dL — ABNORMAL HIGH (ref 70–99)
Glucose-Capillary: 141 mg/dL — ABNORMAL HIGH (ref 70–99)

## 2023-06-22 LAB — MAGNESIUM: Magnesium: 2.2 mg/dL (ref 1.7–2.4)

## 2023-06-22 MED ORDER — HYDROXYZINE HCL 25 MG PO TABS
25.0000 mg | ORAL_TABLET | Freq: Once | ORAL | Status: AC
  Administered 2023-06-22: 25 mg via ORAL
  Filled 2023-06-22: qty 1

## 2023-06-22 NOTE — Plan of Care (Signed)
  Problem: Education: Goal: Knowledge of General Education information will improve Description: Including pain rating scale, medication(s)/side effects and non-pharmacologic comfort measures Outcome: Progressing   Problem: Health Behavior/Discharge Planning: Goal: Ability to manage health-related needs will improve Outcome: Progressing   Problem: Clinical Measurements: Goal: Ability to maintain clinical measurements within normal limits will improve Outcome: Progressing Goal: Will remain free from infection Outcome: Progressing Goal: Diagnostic test results will improve Outcome: Progressing Goal: Respiratory complications will improve Outcome: Progressing Goal: Cardiovascular complication will be avoided Outcome: Progressing   Problem: Activity: Goal: Risk for activity intolerance will decrease Outcome: Progressing   Problem: Nutrition: Goal: Adequate nutrition will be maintained Outcome: Progressing   Problem: Coping: Goal: Level of anxiety will decrease Outcome: Progressing   Problem: Elimination: Goal: Will not experience complications related to bowel motility Outcome: Progressing Goal: Will not experience complications related to urinary retention Outcome: Progressing   Problem: Pain Managment: Goal: General experience of comfort will improve Outcome: Progressing   Problem: Safety: Goal: Ability to remain free from injury will improve Outcome: Progressing   Problem: Skin Integrity: Goal: Risk for impaired skin integrity will decrease Outcome: Progressing   Problem: Education: Goal: Understanding of cardiac disease, CV risk reduction, and recovery process will improve Outcome: Progressing Goal: Individualized Educational Video(s) Outcome: Progressing   Problem: Activity: Goal: Ability to tolerate increased activity will improve Outcome: Progressing   Problem: Cardiac: Goal: Ability to achieve and maintain adequate cardiovascular perfusion will  improve Outcome: Progressing   Problem: Health Behavior/Discharge Planning: Goal: Ability to safely manage health-related needs after discharge will improve Outcome: Progressing   Problem: Education: Goal: Ability to describe self-care measures that may prevent or decrease complications (Diabetes Survival Skills Education) will improve Outcome: Progressing Goal: Individualized Educational Video(s) Outcome: Progressing   Problem: Coping: Goal: Ability to adjust to condition or change in health will improve Outcome: Progressing   Problem: Fluid Volume: Goal: Ability to maintain a balanced intake and output will improve Outcome: Progressing   Problem: Health Behavior/Discharge Planning: Goal: Ability to identify and utilize available resources and services will improve Outcome: Progressing Goal: Ability to manage health-related needs will improve Outcome: Progressing   Problem: Metabolic: Goal: Ability to maintain appropriate glucose levels will improve Outcome: Progressing   Problem: Nutritional: Goal: Maintenance of adequate nutrition will improve Outcome: Progressing Goal: Progress toward achieving an optimal weight will improve Outcome: Progressing   Problem: Skin Integrity: Goal: Risk for impaired skin integrity will decrease Outcome: Progressing   Problem: Tissue Perfusion: Goal: Adequacy of tissue perfusion will improve Outcome: Progressing   Problem: Education: Goal: Understanding of CV disease, CV risk reduction, and recovery process will improve Outcome: Progressing Goal: Individualized Educational Video(s) Outcome: Progressing   Problem: Activity: Goal: Ability to return to baseline activity level will improve Outcome: Progressing   Problem: Cardiovascular: Goal: Ability to achieve and maintain adequate cardiovascular perfusion will improve Outcome: Progressing Goal: Vascular access site(s) Level 0-1 will be maintained Outcome: Progressing    Problem: Health Behavior/Discharge Planning: Goal: Ability to safely manage health-related needs after discharge will improve Outcome: Progressing

## 2023-06-22 NOTE — Progress Notes (Signed)
Rounding Note    Patient Name: [name] Date of Encounter: 06/22/2023  [facility] Cardiologist: Dr. Nanetta Batty  Subjective   Postop day 2 anterior/inferior STEMI treated conservatively.  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  Inpatient Medications    Scheduled Meds:  aspirin  81 mg Oral Daily   Chlorhexidine Gluconate Cloth  6 each Topical Daily   colchicine  0.6 mg Oral BID   famotidine  10 mg Oral Daily   insulin aspart  0-15 Units Subcutaneous TID WC   metoprolol tartrate  25 mg Oral BID   rosuvastatin  40 mg Oral q1800   sodium chloride flush  3 mL Intravenous Q12H   Continuous Infusions:  sodium chloride     PRN Meds: sodium chloride, acetaminophen, morphine injection, nitroGLYCERIN, ondansetron (ZOFRAN) IV, mouth rinse, sodium chloride flush   Vital Signs    Vitals:   06/22/23 0500 06/22/23 0600 06/22/23 0700 06/22/23 0756  BP: 100/76 104/79 (!) 102/59   Pulse: 83 90 (!) 103   Resp: 14 (!) 24 (!) 29   Temp:    98.1 F (36.7 C)  TempSrc:    Oral  SpO2: 92% 93% 93%   Weight:      Height:        Intake/Output Summary (Last 24 hours) at 06/22/2023 0803 Last data filed at 06/22/2023 0250 Gross per 24 hour  Intake --  Output 650 ml  Net -650 ml      06/22/2023    2:56 AM 06/21/2023    2:38 AM 06/20/2023   12:17 PM  Last 3 Weights  Weight (lbs) 141 lb 5 oz 142 lb 13.7 oz 155 lb 13.8 oz  Weight (kg) 64.1 kg 64.8 kg 70.7 kg      Telemetry    Sinus rhythm- Personally Reviewed  ECG    Sinus rhythm at 92 with inferior and anterolateral ST segment elevation that persists.- Personally Reviewed  Physical Exam   GEN: No acute distress.   Neck: No JVD Cardiac: RRR, no murmurs, rubs, or gallops.  Respiratory: Clear to auscultation bilaterally. GI: Soft, nontender, non-distended  MS: No edema; No deformity. Neuro:  Nonfocal  Psych: Normal affect   Labs    High Sensitivity Troponin:    Recent Labs  Lab 06/20/23 1331 06/20/23 1553  TROPONINIHS 5,478* 5,782*     Chemistry Recent Labs  Lab 06/21/23 0541  NA 132*  K 3.5  CL 100  CO2 20*  GLUCOSE 113*  BUN 10  CREATININE 1.28*  CALCIUM 8.6*  PROT 7.0  ALBUMIN 3.3*  AST 21  ALT 15  ALKPHOS 86  BILITOT 0.6  GFRNONAA 51*  ANIONGAP 12    Lipids  Recent Labs  Lab 06/21/23 0541  CHOL 178  TRIG 98  HDL 36*  LDLCALC 122*  CHOLHDL 4.9    Hematology Recent Labs  Lab 06/21/23 0541  WBC 14.9*  RBC 4.30  HGB 13.1  HCT 39.9  MCV 92.8  MCH 30.5  MCHC 32.8  RDW 14.9  PLT 273   Thyroid No results for input(s): "TSH", "FREET4" in the last 168 hours.  BNPNo results for input(s): "BNP", "PROBNP" in the last 168 hours.  DDimer No results for input(s): "DDIMER" in the last 168 hours.   Radiology    ECHOCARDIOGRAM COMPLETE  Result Date: 06/20/2023    ECHOCARDIOGRAM REPORT   Patient Name:   [name] Date of Exam: 06/20/2023 Medical Rec #:  [id number]         Height:       65.0 in Accession #:    [id number]        Weight:       155.9 lb Date of Birth:  02/19/1971          BSA:          1.779 m Patient Age:    52 years          BP:           137/100 mmHg Patient Gender: F                 HR:           75 bpm. Exam Location:  Inpatient Procedure: 2D Echo, Color Doppler and Cardiac Doppler Indications:    STEMI  History:        Patient has no prior history of Echocardiogram examinations.                 Acute MI and Acute Inferior STEMI; Risk Factors:Diabetes and                 Dyslipidemia.  Sonographer:    Milbert Coulter Referring Phys: [id number] HAO MENG IMPRESSIONS  1. Apex is hypokinetic in the setting of distal LAD STEMI. Left ventricular ejection fraction, by estimation, is 55 to 60%. The left ventricle has normal function. There is mild left ventricular hypertrophy. Left ventricular diastolic parameters are consistent with Grade I diastolic dysfunction (impaired relaxation).  2. Right ventricular systolic  function is normal. The right ventricular size is normal. There is normal pulmonary artery systolic pressure.  3. The mitral valve is normal in structure. No evidence of mitral valve regurgitation.  4. The aortic valve is tricuspid. Aortic valve regurgitation is not visualized.  5. The inferior vena cava is normal in size with greater than 50% respiratory variability, suggesting right atrial pressure of 3 mmHg. FINDINGS  Left Ventricle: Apex is hypokinetic in the setting of distal LAD STEMI. Left ventricular ejection fraction, by estimation, is 55 to 60%. The left ventricle has normal function. The left ventricular internal cavity size was normal in size. There is mild left ventricular hypertrophy. Left ventricular diastolic parameters are consistent with Grade I diastolic dysfunction (impaired relaxation). Right Ventricle: The right ventricular size is normal. Right ventricular systolic function is normal. There is normal pulmonary artery systolic pressure. The tricuspid regurgitant velocity is 2.28 m/s, and with an assumed right atrial pressure of 3 mmHg,  the estimated right ventricular systolic pressure is 23.8 mmHg. Left Atrium: Left atrial size was normal in size. Right Atrium: Right atrial size was normal in size. Pericardium: There is no evidence of pericardial effusion. Mitral Valve: The mitral valve is normal in structure. No evidence of mitral valve regurgitation. Tricuspid Valve: The tricuspid valve is normal in structure. Tricuspid valve regurgitation is mild. Aortic Valve: The aortic valve is tricuspid. Aortic valve regurgitation is not visualized. Aortic valve mean gradient measures 3.0 mmHg. Aortic valve peak gradient measures 6.5 mmHg. Aortic valve area, by VTI measures 2.20 cm. Pulmonic Valve: The pulmonic valve was normal in structure. Pulmonic valve regurgitation is not visualized. Aorta: The aortic root and ascending aorta are structurally normal, with no evidence of dilitation. Venous: The  inferior vena cava is normal in size with greater than 50% respiratory variability, suggesting right atrial pressure of 3 mmHg. IAS/Shunts: The interatrial septum was not well visualized.  LEFT VENTRICLE PLAX 2D LVIDd:  4.30 cm   Diastology LVIDs:         2.80 cm   LV e' medial:    8.38 cm/s LV PW:         0.90 cm   LV E/e' medial:  8.1 LV IVS:        0.90 cm   LV e' lateral:   10.10 cm/s LVOT diam:     1.90 cm   LV E/e' lateral: 6.7 LV SV:         53 LV SV Index:   30 LVOT Area:     2.84 cm  RIGHT VENTRICLE RV Basal diam:  3.00 cm RV Mid diam:    2.20 cm RV S prime:     15.20 cm/s TAPSE (M-mode): 2.1 cm LEFT ATRIUM             Index        RIGHT ATRIUM          Index LA diam:        3.00 cm 1.69 cm/m   RA Area:     9.59 cm LA Vol (A2C):   29.0 ml 16.30 ml/m  RA Volume:   20.10 ml 11.30 ml/m LA Vol (A4C):   22.2 ml 12.48 ml/m LA Biplane Vol: 25.8 ml 14.50 ml/m  AORTIC VALVE AV Area (Vmax):    2.37 cm AV Area (Vmean):   2.33 cm AV Area (VTI):     2.20 cm AV Vmax:           127.00 cm/s AV Vmean:          82.700 cm/s AV VTI:            0.241 m AV Peak Grad:      6.5 mmHg AV Mean Grad:      3.0 mmHg LVOT Vmax:         106.00 cm/s LVOT Vmean:        68.100 cm/s LVOT VTI:          0.187 m LVOT/AV VTI ratio: 0.78  AORTA Ao Root diam: 2.50 cm MITRAL VALVE               TRICUSPID VALVE MV Area (PHT): 4.36 cm    TR Peak grad:   20.8 mmHg MV Decel Time: 174 msec    TR Vmax:        228.00 cm/s MV E velocity: 67.60 cm/s MV A velocity: 72.80 cm/s  SHUNTS MV E/A ratio:  0.93        Systemic VTI:  0.19 m                            Systemic Diam: 1.90 cm Carolan Clines Electronically signed by Carolan Clines Signature Date/Time: 06/20/2023/2:44:47 PM    Final    CARDIAC CATHETERIZATION  Result Date: 06/20/2023 Images from the original result were not included.   Dist LAD lesion is 100% stenosed.   There is mild left ventricular systolic dysfunction.   LV end diastolic pressure is normal.   The left ventricular ejection  fraction is 50-55% by visual estimate. [name] is a 52 y.o. female  [id number] LOCATION:  FACILITY: [facility] PHYSICIAN: Nanetta Batty, M.D. February 09, 1971 DATE OF PROCEDURE:  06/20/2023 DATE OF DISCHARGE: CARDIAC CATHETERIZATION History obtained from chart review.  [name] is a 52 year old divorced African-American female mother of one 36 year old daughter with history of diabetes, tobacco abuse and family history  who has had waxing and waning chest pain since Thursday.  It became more persistent this morning.  She went to [facility] where her EKG showed inferolateral ST segment elevation.  She was transported to [facility] for urgent angiography.   [name] had a distal apical LAD occlusion.  Is unclear whether this is atherosclerotic or SCAD .  She has no atherosclerosis in the remainder of her coronary circulation.  Her EF is in the 50% range with apical akinesis.  I elected not to intervene and suspect that she will complete her infarct with minimal LV dysfunction.  She will need to be on aspirin, high-dose statin.  We discussed the importance of smoking cessation.  The sheath was removed and a TR band was placed on the right wrist to achieve patent hemostasis.  The patient left lab in stable condition. Nanetta Batty. MD, [facility] 06/20/2023 12:51 PM     Cardiac Studies   Cardiac catheterization (06/20/2023)  History obtained from chart review.  [name] is a 52 year old divorced African-American female mother of one 71 year old daughter with history of diabetes, tobacco abuse and family history who has had waxing and waning chest pain since Thursday.  It became more persistent this morning.  She went to [facility] where her EKG showed inferolateral ST segment elevation.  She was transported to [facility] for urgent angiography.     IMPRESSION: [name] had a distal apical LAD occlusion.  Is unclear whether this is atherosclerotic or SCAD .  She has no atherosclerosis in the  remainder of her coronary circulation.  Her EF is in the 50% range with apical akinesis.  I elected not to intervene and suspect that she will complete her infarct with minimal LV dysfunction.  She will need to be on aspirin, high-dose statin.  We discussed the importance of smoking cessation.  The sheath was removed and a TR band was placed on the right wrist to achieve patent hemostasis.  The patient left lab in stable condition.   Nanetta Batty. MD, [facility]  onary Diagrams  Diagnostic Dominance: Right     2D echocardiogram (06/20/2023)  IMPRESSIONS     1. Apex is hypokinetic in the setting of distal LAD STEMI. Left  ventricular ejection fraction, by estimation, is 55 to 60%. The left  ventricle has normal function. There is mild left ventricular hypertrophy.  Left ventricular diastolic parameters are  consistent with Grade I diastolic dysfunction (impaired relaxation).   2. Right ventricular systolic function is normal. The right ventricular  size is normal. There is normal pulmonary artery systolic pressure.   3. The mitral valve is normal in structure. No evidence of mitral valve  regurgitation.   4. The aortic valve is tricuspid. Aortic valve regurgitation is not  visualized.   5. The inferior vena cava is normal in size with greater than 50%  respiratory variability, suggesting right atrial pressure of 3 mmHg.   Patient Profile     [name] is a 52 y.o. female with stage III breast cancer s/p lumpectomy, hyperlipidemia and DM2 who is being seen 06/20/2023 for the evaluation of inferior STEMI.   Assessment & Plan    1: Inferior and anterolateral STEMI-peak troponins increased to 5000.  EKG shows continued ST segment elevation in inferior and anterolateral leads.  Cath showed distal LAD occlusion unclear whether this was atherosclerotic or "SCAD".  She does have pleuritic chest pain and was treated with colchicine.  2D echo revealed normal LV  function with an apical wall  motion abnormality.  She is mildly tachycardic and a beta-blocker was started.  2: Hyperlipidemia-on low-dose atorvastatin at home.  LDL was 122.  Transition to high-dose Crestor.  3: Type 2 diabetes-on Trulicity at home, sliding scale here.  Postop day 2 inferior and  anterior STEMI with cath showing apical LAD lesion.  Stable for transfer to telemetry.  She continues to have significant pleuritic chest pain despite being on colchicine.  Will continue to follow.  For questions or updates, please contact [facility] Please consult [email or web address] for contact info under        Signed, Nanetta Batty, MD  06/22/2023, 8:03 AM

## 2023-06-23 ENCOUNTER — Other Ambulatory Visit (HOSPITAL_COMMUNITY): Payer: Self-pay

## 2023-06-23 ENCOUNTER — Telehealth: Payer: Self-pay | Admitting: Cardiology

## 2023-06-23 DIAGNOSIS — E785 Hyperlipidemia, unspecified: Secondary | ICD-10-CM | POA: Insufficient documentation

## 2023-06-23 DIAGNOSIS — I2119 ST elevation (STEMI) myocardial infarction involving other coronary artery of inferior wall: Secondary | ICD-10-CM | POA: Diagnosis not present

## 2023-06-23 DIAGNOSIS — E118 Type 2 diabetes mellitus with unspecified complications: Secondary | ICD-10-CM | POA: Insufficient documentation

## 2023-06-23 LAB — GLUCOSE, CAPILLARY
Glucose-Capillary: 104 mg/dL — ABNORMAL HIGH (ref 70–99)
Glucose-Capillary: 116 mg/dL — ABNORMAL HIGH (ref 70–99)

## 2023-06-23 MED ORDER — COLCHICINE 0.6 MG PO TABS
0.6000 mg | ORAL_TABLET | Freq: Two times a day (BID) | ORAL | 0 refills | Status: AC
  Filled 2023-06-23: qty 60, 30d supply, fill #0

## 2023-06-23 MED ORDER — ROSUVASTATIN CALCIUM 40 MG PO TABS
40.0000 mg | ORAL_TABLET | Freq: Every day | ORAL | 1 refills | Status: AC
  Filled 2023-06-23: qty 90, 90d supply, fill #0

## 2023-06-23 MED ORDER — NITROGLYCERIN 0.4 MG SL SUBL
0.4000 mg | SUBLINGUAL_TABLET | SUBLINGUAL | 2 refills | Status: AC | PRN
  Filled 2023-06-23: qty 25, 5d supply, fill #0

## 2023-06-23 MED ORDER — METOPROLOL TARTRATE 25 MG PO TABS
25.0000 mg | ORAL_TABLET | Freq: Two times a day (BID) | ORAL | 0 refills | Status: AC
  Filled 2023-06-23: qty 180, 90d supply, fill #0

## 2023-06-23 NOTE — Discharge Summary (Addendum)
Discharge Summary    Patient ID: LUGENE DOLLISON MRN: [id number]; DOB: 1971/01/02  Admit date: 06/20/2023 Discharge date: 06/23/2023  PCP:  Dolan Amen, FNP   [facility] Providers Cardiologist:  Nanetta Batty, MD     Discharge Diagnoses    Principal Problem:   Acute ST elevation myocardial infarction (STEMI) of inferior wall [facility]) Active Problems:   STEMI involving left anterior descending coronary artery (HCC)   Hyperlipidemia   Type 2 diabetes mellitus with complication, without long-term current use of insulin [facility])   Diagnostic Studies/Procedures    Cath: 06/20/2023  IMPRESSION: Ms. [name] had a distal apical LAD occlusion.  Is unclear whether this is atherosclerotic or SCAD .  She has no atherosclerosis in the remainder of her coronary circulation.  Her EF is in the 50% range with apical akinesis.  I elected not to intervene and suspect that she will complete her infarct with minimal LV dysfunction.  She will need to be on aspirin, high-dose statin.  We discussed the importance of smoking cessation.  The sheath was removed and a TR band was placed on the right wrist to achieve patent hemostasis.  The patient left lab in stable condition.   Nanetta Batty. MD, [facility] 06/20/2023 12:51 PM  Diagnostic Dominance: Right  Echo: 06/20/2023  IMPRESSIONS     1. Apex is hypokinetic in the setting of distal LAD STEMI. Left  ventricular ejection fraction, by estimation, is 55 to 60%. The left  ventricle has normal function. There is mild left ventricular hypertrophy.  Left ventricular diastolic parameters are  consistent with Grade I diastolic dysfunction (impaired relaxation).   2. Right ventricular systolic function is normal. The right ventricular  size is normal. There is normal pulmonary artery systolic pressure.   3. The mitral valve is normal in structure. No evidence of mitral valve  regurgitation.   4. The aortic valve is tricuspid. Aortic valve regurgitation  is not  visualized.   5. The inferior vena cava is normal in size with greater than 50%  respiratory variability, suggesting right atrial pressure of 3 mmHg.   FINDINGS   Left Ventricle: Apex is hypokinetic in the setting of distal LAD STEMI.  Left ventricular ejection fraction, by estimation, is 55 to 60%. The left  ventricle has normal function. The left ventricular internal cavity size  was normal in size. There is mild  left ventricular hypertrophy. Left ventricular diastolic parameters are  consistent with Grade I diastolic dysfunction (impaired relaxation).   Right Ventricle: The right ventricular size is normal. Right ventricular  systolic function is normal. There is normal pulmonary artery systolic  pressure. The tricuspid regurgitant velocity is 2.28 m/s, and with an  assumed right atrial pressure of 3 mmHg,   the estimated right ventricular systolic pressure is 23.8 mmHg.   Left Atrium: Left atrial size was normal in size.   Right Atrium: Right atrial size was normal in size.   Pericardium: There is no evidence of pericardial effusion.   Mitral Valve: The mitral valve is normal in structure. No evidence of  mitral valve regurgitation.   Tricuspid Valve: The tricuspid valve is normal in structure. Tricuspid  valve regurgitation is mild.   Aortic Valve: The aortic valve is tricuspid. Aortic valve regurgitation is  not visualized. Aortic valve mean gradient measures 3.0 mmHg. Aortic valve  peak gradient measures 6.5 mmHg. Aortic valve area, by VTI measures 2.20  cm.   Pulmonic Valve: The pulmonic valve was normal in structure.  Pulmonic valve  regurgitation is not visualized.   Aorta: The aortic root and ascending aorta are structurally normal, with  no evidence of dilitation.   Venous: The inferior vena cava is normal in size with greater than 50%  respiratory variability, suggesting right atrial pressure of 3 mmHg.   IAS/Shunts: The interatrial septum was not  well visualized.      _____________   History of Present Illness     [name] is a 52 y.o. female with stage III breast cancer s/p lumpectomy, hyperlipidemia and DM2 who is being seen 06/20/2023 for the evaluation of inferior STEMI. She does not have prior cardiac history. She was in her usual state of health until Thursday, 06/16/2023 when she started having intermittent substernal chest discomfort. She had recurrent chest discomfort in the morning of 06/20/2023, this time her chest discomfort lasted longer. This prompted the patient to seek urgent medical attention at [facility] ED. Initial EKG was concerning for inferior STEMI. First at bedtime troponin was 7229. Creatinine 1.21. Alkaline phosphatase 125, AST 44, ALT 19. Glucose 129. CBC showed white blood cell count 18.2, hemoglobin 14.5. Patient was transferred urgently to [facility] for cardiac catheterization. Code STEMI was activated. Patient arrived at [facility] around 12 PM and was taking immediately to the Cath Lab for urgent cardiac catheterization. Patient so far has received 4000 units IV heparin, 4 mg of morphine and 2 baby aspirin. On arrival, she was chest pain-free.   Hospital Course     Inferior and anterolateral STEMI SCAD? -- presented with chest pain, and underwent emergent cardiac cath noted above with distal LAD occlusion unclear whether this was atherosclerotic or "SCAD".  EKG showed continued ST segment elevation in inferior and anterolateral leads. She did continue to have pleuritic chest pain and was treated with colchicine. CRP 162, sed rate 33. 2D echo revealed normal LV function with an apical wall motion abnormality.  She was mildly tachycardic and started on BB therapy -- continue ASA, statin, metoprolol 25mg  BID -- with her pleuritic chest pain, will continue colchicine 0.6mg  BID for at least one month   HLD -- on low-dose atorvastatin at home.  LDL was 122.  Transition to high-dose  Crestor -- will need LFT/FLP in 8 weeks    DM -- on Trulicity, resumed at discharge  Patient was seen by Dr. Allyson Sabal and deemed stable for discharge. Follow arranged in the office. Medications sent to [facility] pharmacy. Educated by pharmD prior to discharge.   Did the patient have an acute coronary syndrome (MI, NSTEMI, STEMI, etc) this admission?:  Yes                               AHA/ACC Clinical Performance & Quality Measures: Aspirin prescribed? - Yes ADP Receptor Inhibitor (Plavix/Clopidogrel, Brilinta/Ticagrelor or Effient/Prasugrel) prescribed (includes medically managed patients)? - No - unclear if SCAD or ACS Beta Blocker prescribed? - Yes High Intensity Statin (Lipitor 40-80mg  or Crestor 20-40mg ) prescribed? - Yes EF assessed during THIS hospitalization? - Yes For EF <40%, was ACEI/ARB prescribed? - Not Applicable (EF >/= 40%) For EF <40%, Aldosterone Antagonist (Spironolactone or Eplerenone) prescribed? - Not Applicable (EF >/= 40%) Cardiac Rehab Phase II ordered (including medically managed patients)? - Yes    The patient will be scheduled for a TOC follow up appointment in 10-14 days.  A message has been sent to the [facility] and Scheduling Pool at the  office where the patient should be seen for follow up.  _____________  Discharge Vitals Blood pressure 102/75, pulse 87, temperature 97.8 F (36.6 C), temperature source Oral, resp. rate (!) 21, height 5\' 5"  (1.651 m), weight 64.1 kg, SpO2 96%.  Filed Weights   06/20/23 1217 06/21/23 0238 06/22/23 0256  Weight: 70.7 kg 64.8 kg 64.1 kg    Labs & Radiologic Studies    CBC Recent Labs    06/21/23 0541  WBC 14.9*  HGB 13.1  HCT 39.9  MCV 92.8  PLT 273   Basic Metabolic Panel Recent Labs    16/10/96 0814 06/23/23 0336  NA 131* 134*  K 4.6 4.2  CL 97* 105  CO2 19* 20*  GLUCOSE 189* 118*  BUN 13 18  CREATININE 1.35* 1.33*  CALCIUM 9.0 8.8*  MG 2.2 2.4   Liver Function Tests Recent Labs    06/21/23 0541  AST  21  ALT 15  ALKPHOS 86  BILITOT 0.6  PROT 7.0  ALBUMIN 3.3*   No results for input(s): "LIPASE", "AMYLASE" in the last 72 hours. High Sensitivity Troponin:   Recent Labs  Lab 06/20/23 1331 06/20/23 1553  TROPONINIHS 5,478* 5,782*    BNP Invalid input(s): "POCBNP" D-Dimer No results for input(s): "DDIMER" in the last 72 hours. Hemoglobin A1C Recent Labs    06/20/23 1331  HGBA1C 5.5   Fasting Lipid Panel Recent Labs    06/21/23 0541  CHOL 178  HDL 36*  LDLCALC 122*  TRIG 98  CHOLHDL 4.9   Thyroid Function Tests No results for input(s): "TSH", "T4TOTAL", "T3FREE", "THYROIDAB" in the last 72 hours.  Invalid input(s): "FREET3" _____________  ECHOCARDIOGRAM COMPLETE  Result Date: 06/20/2023    ECHOCARDIOGRAM REPORT   Patient Name:   [name] Date of Exam: 06/20/2023 Medical Rec #:  [id number]         Height:       65.0 in Accession #:    [id number]        Weight:       155.9 lb Date of Birth:  11-22-1970          BSA:          1.779 m Patient Age:    51 years          BP:           137/100 mmHg Patient Gender: F                 HR:           75 bpm. Exam Location:  Inpatient Procedure: 2D Echo, Color Doppler and Cardiac Doppler Indications:    STEMI  History:        Patient has no prior history of Echocardiogram examinations.                 Acute MI and Acute Inferior STEMI; Risk Factors:Diabetes and                 Dyslipidemia.  Sonographer:    Milbert Coulter Referring Phys: [id number] HAO MENG IMPRESSIONS  1. Apex is hypokinetic in the setting of distal LAD STEMI. Left ventricular ejection fraction, by estimation, is 55 to 60%. The left ventricle has normal function. There is mild left ventricular hypertrophy. Left ventricular diastolic parameters are consistent with Grade I diastolic dysfunction (impaired relaxation).  2. Right ventricular systolic function is normal. The right ventricular size is normal. There is normal pulmonary artery systolic pressure.  3.  The mitral valve  is normal in structure. No evidence of mitral valve regurgitation.  4. The aortic valve is tricuspid. Aortic valve regurgitation is not visualized.  5. The inferior vena cava is normal in size with greater than 50% respiratory variability, suggesting right atrial pressure of 3 mmHg. FINDINGS  Left Ventricle: Apex is hypokinetic in the setting of distal LAD STEMI. Left ventricular ejection fraction, by estimation, is 55 to 60%. The left ventricle has normal function. The left ventricular internal cavity size was normal in size. There is mild left ventricular hypertrophy. Left ventricular diastolic parameters are consistent with Grade I diastolic dysfunction (impaired relaxation). Right Ventricle: The right ventricular size is normal. Right ventricular systolic function is normal. There is normal pulmonary artery systolic pressure. The tricuspid regurgitant velocity is 2.28 m/s, and with an assumed right atrial pressure of 3 mmHg,  the estimated right ventricular systolic pressure is 23.8 mmHg. Left Atrium: Left atrial size was normal in size. Right Atrium: Right atrial size was normal in size. Pericardium: There is no evidence of pericardial effusion. Mitral Valve: The mitral valve is normal in structure. No evidence of mitral valve regurgitation. Tricuspid Valve: The tricuspid valve is normal in structure. Tricuspid valve regurgitation is mild. Aortic Valve: The aortic valve is tricuspid. Aortic valve regurgitation is not visualized. Aortic valve mean gradient measures 3.0 mmHg. Aortic valve peak gradient measures 6.5 mmHg. Aortic valve area, by VTI measures 2.20 cm. Pulmonic Valve: The pulmonic valve was normal in structure. Pulmonic valve regurgitation is not visualized. Aorta: The aortic root and ascending aorta are structurally normal, with no evidence of dilitation. Venous: The inferior vena cava is normal in size with greater than 50% respiratory variability, suggesting right atrial pressure of 3 mmHg.  IAS/Shunts: The interatrial septum was not well visualized.  LEFT VENTRICLE PLAX 2D LVIDd:         4.30 cm   Diastology LVIDs:         2.80 cm   LV e' medial:    8.38 cm/s LV PW:         0.90 cm   LV E/e' medial:  8.1 LV IVS:        0.90 cm   LV e' lateral:   10.10 cm/s LVOT diam:     1.90 cm   LV E/e' lateral: 6.7 LV SV:         53 LV SV Index:   30 LVOT Area:     2.84 cm  RIGHT VENTRICLE RV Basal diam:  3.00 cm RV Mid diam:    2.20 cm RV S prime:     15.20 cm/s TAPSE (M-mode): 2.1 cm LEFT ATRIUM             Index        RIGHT ATRIUM          Index LA diam:        3.00 cm 1.69 cm/m   RA Area:     9.59 cm LA Vol (A2C):   29.0 ml 16.30 ml/m  RA Volume:   20.10 ml 11.30 ml/m LA Vol (A4C):   22.2 ml 12.48 ml/m LA Biplane Vol: 25.8 ml 14.50 ml/m  AORTIC VALVE AV Area (Vmax):    2.37 cm AV Area (Vmean):   2.33 cm AV Area (VTI):     2.20 cm AV Vmax:           127.00 cm/s AV Vmean:          82.700  cm/s AV VTI:            0.241 m AV Peak Grad:      6.5 mmHg AV Mean Grad:      3.0 mmHg LVOT Vmax:         106.00 cm/s LVOT Vmean:        68.100 cm/s LVOT VTI:          0.187 m LVOT/AV VTI ratio: 0.78  AORTA Ao Root diam: 2.50 cm MITRAL VALVE               TRICUSPID VALVE MV Area (PHT): 4.36 cm    TR Peak grad:   20.8 mmHg MV Decel Time: 174 msec    TR Vmax:        228.00 cm/s MV E velocity: 67.60 cm/s MV A velocity: 72.80 cm/s  SHUNTS MV E/A ratio:  0.93        Systemic VTI:  0.19 m                            Systemic Diam: 1.90 cm Carolan Clines Electronically signed by Carolan Clines Signature Date/Time: 06/20/2023/2:44:47 PM    Final    CARDIAC CATHETERIZATION  Result Date: 06/20/2023 Images from the original result were not included.   Dist LAD lesion is 100% stenosed.   There is mild left ventricular systolic dysfunction.   LV end diastolic pressure is normal.   The left ventricular ejection fraction is 50-55% by visual estimate. [name] is a 52 y.o. female  [id number] LOCATION:  FACILITY: [facility] PHYSICIAN:  Nanetta Batty, M.D. [birth year] DATE OF PROCEDURE:  06/20/2023 DATE OF DISCHARGE: CARDIAC CATHETERIZATION History obtained from chart review.  [name] is a 52 year old divorced African-American female mother of one 55 year old daughter with history of diabetes, tobacco abuse and family history who has had waxing and waning chest pain since Thursday.  It became more persistent this morning.  She went to [facility] where her EKG showed inferolateral ST segment elevation.  She was transported to [facility] for urgent angiography.   [name] had a distal apical LAD occlusion.  Is unclear whether this is atherosclerotic or SCAD .  She has no atherosclerosis in the remainder of her coronary circulation.  Her EF is in the 50% range with apical akinesis.  I elected not to intervene and suspect that she will complete her infarct with minimal LV dysfunction.  She will need to be on aspirin, high-dose statin.  We discussed the importance of smoking cessation.  The sheath was removed and a TR band was placed on the right wrist to achieve patent hemostasis.  The patient left lab in stable condition. Nanetta Batty. MD, [facility] 06/20/2023 12:51 PM    Disposition   Pt is being discharged home today in good condition.  Follow-up Plans & Appointments     Follow-up Information     Carlos Levering, NP Follow up on 06/30/2023.   Specialty: Cardiology Why: at 8:25am for your follow up appt with cardiology Contact information: [address] [place] [address] [phone]                Discharge Instructions     Call MD for:  redness, tenderness, or signs of infection (pain, swelling, redness, odor or green/yellow discharge around incision site)   Complete by: As directed    Diet - low sodium heart healthy   Complete by: As  directed    Discharge instructions   Complete by: As directed    Radial Site Care Refer to this sheet in the next few weeks. These instructions provide you  with information on caring for yourself after your procedure. Your caregiver may also give you more specific instructions. Your treatment has been planned according to current medical practices, but problems sometimes occur. Call your caregiver if you have any problems or questions after your procedure. HOME CARE INSTRUCTIONS You may shower the day after the procedure. Remove the bandage (dressing) and gently wash the site with plain soap and water. Gently pat the site dry.  Do not apply powder or lotion to the site.  Do not submerge the affected site in water for 3 to 5 days.  Inspect the site at least twice daily.  Do not flex or bend the affected arm for 24 hours.  No lifting over 5 pounds (2.3 kg) for 5 days after your procedure.  Do not drive home if you are discharged the same day of the procedure. Have someone else drive you.  You may drive 24 hours after the procedure unless otherwise instructed by your caregiver.  What to expect: Any bruising will usually fade within 1 to 2 weeks.  Blood that collects in the tissue (hematoma) may be painful to the touch. It should usually decrease in size and tenderness within 1 to 2 weeks.  SEEK IMMEDIATE MEDICAL CARE IF: You have unusual pain at the radial site.  You have redness, warmth, swelling, or pain at the radial site.  You have drainage (other than a small amount of blood on the dressing).  You have chills.  You have a fever or persistent symptoms for more than 72 hours.  You have a fever and your symptoms suddenly get worse.  Your arm becomes pale, cool, tingly, or numb.  You have heavy bleeding from the site. Hold pressure on the site.   Increase activity slowly   Complete by: As directed         Discharge Medications   Allergies as of 06/23/2023       Reactions   Benadryl [diphenhydramine] Hives, Rash   Depen [penicillamine] Hives, Itching   Jardiance [empagliflozin] Rash   Yeast infection   Vibra-tab [doxycycline] Nausea And  Vomiting, Other (See Comments)   Stomach upset   Z-pak [azithromycin] Rash   Amoxil [amoxicillin] Hives   Glucophage [metformin] Other (See Comments)   Stomach upset   Glucotrol [glipizide] Other (See Comments)   Headache    Lipitor [atorvastatin] Other (See Comments)   Heart fluttering        Medication List     TAKE these medications    aspirin EC 81 MG tablet Take 81 mg by mouth daily.   colchicine 0.6 MG tablet Take 1 tablet (0.6 mg total) by mouth 2 (two) times daily.   fluticasone 50 MCG/ACT nasal spray Commonly known as: FLONASE Place 2 sprays into both nostrils daily as needed for allergies.   ibuprofen 200 MG tablet Commonly known as: ADVIL Take 400 mg by mouth 2 (two) times daily as needed for moderate pain or headache.   metoprolol tartrate 25 MG tablet Commonly known as: LOPRESSOR Take 1 tablet (25 mg total) by mouth 2 (two) times daily.   nitroGLYCERIN 0.4 MG SL tablet Commonly known as: NITROSTAT Place 1 tablet (0.4 mg total) under the tongue every 5 (five) minutes x 3 doses as needed for chest pain.   rosuvastatin 40 MG  tablet Commonly known as: CRESTOR Take 1 tablet (40 mg total) by mouth daily at 6 PM.   Trulicity 0.75 MG/0.5ML Sopn Generic drug: Dulaglutide Inject 0.75 mg into the skin every Sunday.        Outstanding Labs/Studies   FLP/LFTs in 8 weeks   Duration of Discharge Encounter   Greater than 30 minutes including physician time.  Signed, Laverda Page, NP 06/23/2023, 12:33 PM   Agree with note by Laverda Page NP-C  Pt admitted with antero apical STEMI. Urgent cath revealed an apical LAD occlusion. It could not be differentiated whether this was atherosclerotic vs SCAD so it was treated conservatively. Her EF was nl with an apical WMA. She had post MI pericarditis treated with colchicine which has thankfully resolved. She was treated with ASA, BB, and statin. She was deemed stable for DC home this AM. Will arrange TOC7 the  ROV with me 6-8 weeks.   Runell Gess, M.D., FACP, [facility], [facility] [address] [place], [place]  [address]  [phone] 06/23/2023 1:46 PM

## 2023-06-23 NOTE — Progress Notes (Signed)
Rounding Note    Patient Name: [name] Date of Encounter: 06/23/2023  [facility] Cardiologist: Dr. Nanetta Batty  Subjective   Postop day 3 anterior/inferior STEMI treated conservatively.  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  Inpatient Medications    Scheduled Meds:  aspirin  81 mg Oral Daily   Chlorhexidine Gluconate Cloth  6 each Topical Daily   colchicine  0.6 mg Oral BID   famotidine  10 mg Oral Daily   insulin aspart  0-15 Units Subcutaneous TID WC   metoprolol tartrate  25 mg Oral BID   rosuvastatin  40 mg Oral q1800   sodium chloride flush  3 mL Intravenous Q12H   Continuous Infusions:  sodium chloride     PRN Meds: sodium chloride, acetaminophen, morphine injection, nitroGLYCERIN, ondansetron (ZOFRAN) IV, mouth rinse, sodium chloride flush   Vital Signs    Vitals:   06/23/23 0500 06/23/23 0600 06/23/23 0700 06/23/23 0800  BP: 104/79 91/62 104/72 103/81  Pulse: 81 80 90 87  Resp: 15 15 17 17   Temp:   98 F (36.7 C)   TempSrc:   Oral   SpO2: 96% 96% 96% 95%  Weight:      Height:        Intake/Output Summary (Last 24 hours) at 06/23/2023 0816 Last data filed at 06/22/2023 2230 Gross per 24 hour  Intake --  Output 550 ml  Net -550 ml      06/22/2023    2:56 AM 06/21/2023    2:38 AM 06/20/2023   12:17 PM  Last 3 Weights  Weight (lbs) 141 lb 5 oz 142 lb 13.7 oz 155 lb 13.8 oz  Weight (kg) 64.1 kg 64.8 kg 70.7 kg      Telemetry    Sinus rhythm- Personally Reviewed  ECG    Not performed today- Personally Reviewed  Physical Exam   GEN: No acute distress.   Neck: No JVD Cardiac: RRR, no murmurs, rubs, or gallops.  Respiratory: Clear to auscultation bilaterally. GI: Soft, nontender, non-distended  MS: No edema; No deformity. Neuro:  Nonfocal  Psych: Normal affect   Labs    High Sensitivity Troponin:   Recent Labs  Lab 06/20/23 1331  06/20/23 1553  TROPONINIHS 5,478* 5,782*     Chemistry Recent Labs  Lab 06/21/23 0541 06/22/23 0814 06/23/23 0336  NA 132* 131* 134*  K 3.5 4.6 4.2  CL 100 97* 105  CO2 20* 19* 20*  GLUCOSE 113* 189* 118*  BUN 10 13 18   CREATININE 1.28* 1.35* 1.33*  CALCIUM 8.6* 9.0 8.8*  MG  --  2.2 2.4  PROT 7.0  --   --   ALBUMIN 3.3*  --   --   AST 21  --   --   ALT 15  --   --   ALKPHOS 86  --   --   BILITOT 0.6  --   --   GFRNONAA 51* 48* 49*  ANIONGAP 12 15 9     Lipids  Recent Labs  Lab 06/21/23 0541  CHOL 178  TRIG 98  HDL 36*  LDLCALC 122*  CHOLHDL 4.9    Hematology Recent Labs  Lab 06/21/23 0541  WBC 14.9*  RBC 4.30  HGB 13.1  HCT 39.9  MCV 92.8  MCH 30.5  MCHC 32.8  RDW 14.9  PLT 273   Thyroid No results for input(s): "TSH", "FREET4" in the  last 168 hours.  BNPNo results for input(s): "BNP", "PROBNP" in the last 168 hours.  DDimer No results for input(s): "DDIMER" in the last 168 hours.   Radiology    No results found.  Cardiac Studies   Cardiac catheterization (06/20/2023)  History obtained from chart review.  Ms. Coda is a 52 year old divorced African-American female mother of one 13 year old daughter with history of diabetes, tobacco abuse and family history who has had waxing and waning chest pain since Thursday.  It became more persistent this morning.  She went to [facility] where her EKG showed inferolateral ST segment elevation.  She was transported to [facility] for urgent angiography.     IMPRESSION: Ms. Daves had a distal apical LAD occlusion.  Is unclear whether this is atherosclerotic or SCAD .  She has no atherosclerosis in the remainder of her coronary circulation.  Her EF is in the 50% range with apical akinesis.  I elected not to intervene and suspect that she will complete her infarct with minimal LV dysfunction.  She will need to be on aspirin, high-dose statin.  We discussed the importance of smoking cessation.  The sheath was  removed and a TR band was placed on the right wrist to achieve patent hemostasis.  The patient left lab in stable condition.   Nanetta Batty. MD, [facility]  onary Diagrams  Diagnostic Dominance: Right     2D echocardiogram (06/20/2023)  IMPRESSIONS     1. Apex is hypokinetic in the setting of distal LAD STEMI. Left  ventricular ejection fraction, by estimation, is 55 to 60%. The left  ventricle has normal function. There is mild left ventricular hypertrophy.  Left ventricular diastolic parameters are  consistent with Grade I diastolic dysfunction (impaired relaxation).   2. Right ventricular systolic function is normal. The right ventricular  size is normal. There is normal pulmonary artery systolic pressure.   3. The mitral valve is normal in structure. No evidence of mitral valve  regurgitation.   4. The aortic valve is tricuspid. Aortic valve regurgitation is not  visualized.   5. The inferior vena cava is normal in size with greater than 50%  respiratory variability, suggesting right atrial pressure of 3 mmHg.   Patient Profile     LAVERLE FARVER is a 52 y.o. female with stage III breast cancer s/p lumpectomy, hyperlipidemia and DM2 who is being seen 06/20/2023 for the evaluation of inferior STEMI.   Assessment & Plan    1: Inferior and anterolateral STEMI-peak troponins increased to 5000.  EKG shows continued ST segment elevation in inferior and anterolateral leads.  Cath showed distal LAD occlusion unclear whether this was atherosclerotic or "SCAD".  She does have pleuritic chest pain and was treated with colchicine.  2D echo revealed normal LV function with an apical wall motion abnormality.  She is mildly tachycardic and a beta-blocker was started.  2: Hyperlipidemia-on low-dose atorvastatin at home.  LDL was 122.  Transition to high-dose Crestor.  3: Type 2 diabetes-on Trulicity at home, sliding scale here.  Postop day 3 inferior and  anterior STEMI with cath showing  apical LAD lesion.  Her pleuritic chest pain has resolved.  She is on aspirin, Crestor, beta-blocker and colchicine.  Stable for discharge today.  TOC 7 followed by return office visit with me in 6 to 8 weeks.  For questions or updates, please contact [facility] Please consult [email or web address] for contact info under        Signed, Christiane Ha  Allyson Sabal, MD  06/23/2023, 8:16 AM

## 2023-06-23 NOTE — Progress Notes (Signed)
Discharge instructions were provided to the patient, all questions were answered. PIV were removed. Patient brought to front entrance via wheelchair by [facility] NT.

## 2023-06-23 NOTE — Telephone Encounter (Signed)
   Transition of Care Follow-up Phone Call Request    Patient Name: [name] Date of Birth: [birth year] Date of Encounter: 06/23/2023  Primary Care Provider:  Dolan Amen, FNP Primary Cardiologist:  Nanetta Batty, MD  [name] has been scheduled for a transition of care follow up appointment with a HeartCare provider:  Carlos Levering 8/15  Please reach out to [name] within 48 hours to confirm appointment and review transition of care protocol questionnaire.  Laverda Page, NP  06/23/2023, 11:30 AM

## 2023-06-23 NOTE — Telephone Encounter (Signed)
Pt is still admitted.  

## 2023-06-23 NOTE — Telephone Encounter (Signed)
Left voicemail to return call office

## 2023-06-23 NOTE — Progress Notes (Addendum)
CARDIAC REHAB PHASE I   PRE:  Rate/Rhythm: 80 SR  BP:  Sitting: 102/75      SaO2: RA  MODE:  Ambulation: 740 ft   AD:   no AD  POST:  Rate/Rhythm: 88 SR  BP:  Sitting: 110/83      SaO2: RA  Pt amb with supervision assistance, pt denies CP and SOB during amb and was returned to room w/o complaint.   Pt was educated on MI, wt restrictions, no baths/daily wash-ups, s/s of infection, ex guidelines, s/s to stop exercising, NTG use and calling 911, heart healthy diet, risk factors and CRPII. Pt received materials on exercise, diet, and CRPII. Will refer to AP.   Faustino Congress  ACSM-CEP 12:29 PM 06/23/2023

## 2023-06-24 NOTE — Telephone Encounter (Signed)
Call to patient .  VM not set up  Needs TOC from [place] discharge on 8/8 for her F/U 8/15 at 8:25am with D. Wittenborn

## 2023-06-27 NOTE — Telephone Encounter (Signed)
Patient contacted regarding discharge from [facility]  on August 8.  Patient understands to follow up with provider D. Wittenborn on 06/30/23 at 8:25 am at [facility] office Patient understands discharge instructions? Yes Patient understands medications and regiment? Yes Patient understands to bring all medications to this visit? Yes  Ask patient:  Are you enrolled in My Chart,  No. Sent message for her to set up My chart                Do you have any questions about your medications? No Are you taking your pain medication? None              How is your pain controlled?  None              If you require a refill on pain medications, know that the same medication/ amount may not be prescribed or a refill may not be given.  Please contact your pharmacy for refill requests.               Do you have help at home with ADL's? None needed

## 2023-06-29 NOTE — Progress Notes (Unsigned)
Cardiology Clinic Note   Date: 06/30/2023 ID: [name], DOB 09-18-1971, MRN [id number]  Primary Cardiologist:  Nanetta Batty, MD  Patient Profile    [name] is a 52 y.o. female who presents to the clinic today for hospital follow up.     Past medical history significant for: CAD. LHC 06/20/2023 (STEMI): Distal LAD 100%.  Mild left ventricular systolic dysfunction.  Normal LVEDP.  Recommend medical treatment. Echo 06/20/2023: EF 55 to 60%.  Apex is hypokinetic.  Mild LVH.  Grade I DD.  Normal RV function.  Normal PA pressure.  No significant valvular abnormalities. Hyperlipidemia. Lipid panel 06/21/2023: LDL 122, HDL 36, TG 90, total 178. LPa 06/21/2023: 75.8. T2DM. Tobacco abuse.     History of Present Illness    [name] was first evaluated by Dr. Allyson Sabal on 06/20/2023 for STEMI.  Patient presented to [facility] for substernal chest pain.  Troponin elevated at 7229.  EKG showed ST elevation in the inferior leads.  She was transferred urgently to [facility] and taken to the Cath Lab.  She was found to have 100% occluded distal LAD with recommendations to treat medically.  Echo showed normal LV/RV function, hypokinetic apex and mild LVH.  PT following catheterization patient complained of some pleuritic chest pain.  It was felt she has post MI pericarditis and was started on colchicine to be taken for a month.  She was discharged on 06/23/2023.  Today, patient is accompanied by her mother. She is doing well since discharge from the hospital. Patient denies shortness of breath or dyspnea on exertion. No chest pain, pressure, or tightness. Denies lower extremity edema, orthopnea, or PND. No palpitations. Discussed medication at length. Cath site is well healed without drainage. She typically walks for exercise. She is interested in participating in cardiac rehab.      ROS: All other systems reviewed and are otherwise negative except as noted in History of  Present Illness.  Studies Reviewed      EKG is not ordered today.      Physical Exam    VS:  BP 122/80 (BP Location: Right Arm, Patient Position: Sitting, Cuff Size: Normal)   Pulse 83   Ht 5\' 5"  (1.651 m)   Wt 139 lb (63 kg)   SpO2 99%   BMI 23.13 kg/m  , BMI Body mass index is 23.13 kg/m.  GEN: Well nourished, well developed, in no acute distress. Neck: No JVD or carotid bruits. Cardiac:  RRR. No murmurs. No rubs or gallops.   Respiratory:  Respirations regular and unlabored. Clear to auscultation without rales, wheezing or rhonchi. GI: Soft, nontender, nondistended. Extremities: Radials/DP/PT 2+ and equal bilaterally. No clubbing or cyanosis. No edema.  Skin: Warm and dry, no rash. Neuro: Strength intact.  Assessment & Plan    CAD.  LHC in the setting of STEMI August 2024 showed distal LAD occlusion treated medically.  Echo showed normal LV/RV function.  Patient denies chest pain, tightness, pressure. Cath site is well healed with resolving ecchymosis and no drainage or tenderness. She is interested in cardiac rehab. Continue aspirin, metoprolol, rosuvastatin, as needed SL NTG. Post MI pericarditis.  Patient denies any further pleuritic pain. Colchicine was initially causing some diarrhea. No pain with deep inspiration or laying back.  Continue colchicine until beginning of September. Hyperlipidemia.  LDL August 2024 122, not at goal.  Continue rosuvastatin. Plan to repeat lipids at follow up visit.  Tobacco abuse. Patient decreased smoking to 1/2  pack a day. She is interested in quitting. PCP gave recommendations for nicotine patch. Discussed other strategies to slowly cut down cigarettes to complete cessation.   Disposition: Return in 3 months or sooner as needed.     Cardiac Rehabilitation Eligibility Assessment  The patient is ready to start cardiac rehabilitation from a cardiac standpoint.        Signed, Etta Grandchild. Boni Maclellan, DNP, NP-C

## 2023-06-30 ENCOUNTER — Encounter: Payer: Self-pay | Admitting: Student

## 2023-06-30 ENCOUNTER — Ambulatory Visit: Payer: 59 | Attending: Student | Admitting: Student

## 2023-06-30 VITALS — BP 122/80 | HR 83 | Ht 65.0 in | Wt 139.0 lb

## 2023-06-30 DIAGNOSIS — Z720 Tobacco use: Secondary | ICD-10-CM

## 2023-06-30 DIAGNOSIS — E785 Hyperlipidemia, unspecified: Secondary | ICD-10-CM | POA: Diagnosis not present

## 2023-06-30 DIAGNOSIS — I241 Dressler's syndrome: Secondary | ICD-10-CM | POA: Diagnosis not present

## 2023-06-30 DIAGNOSIS — I2510 Atherosclerotic heart disease of native coronary artery without angina pectoris: Secondary | ICD-10-CM

## 2023-06-30 NOTE — Patient Instructions (Signed)
Medication Instructions:  *If you need a refill on your cardiac medications before your next appointment, please call your pharmacy*   Lab Work: If you have labs (blood work) drawn today and your tests are completely normal, you will receive your results only by: MyChart Message (if you have MyChart) OR A paper copy in the mail  If you have any lab test that is abnormal or we need to change your treatment, we will call you to review the results.   Follow-Up: At [facility], you and your health needs are our priority.  As part of our continuing mission to provide you with exceptional heart care, we have created designated Provider Care Teams.  These Care Teams include your primary Cardiologist (physician) and Advanced Practice Providers (APPs -  Physician Assistants and Nurse Practitioners) who all work together to provide you with the care you need, when you need it.  We recommend signing up for the patient portal called "MyChart".  Sign up information is provided on this After Visit Summary.  MyChart is used to connect with patients for Virtual Visits (Telemedicine).  Patients are able to view lab/test results, encounter notes, upcoming appointments, etc.  Non-urgent messages can be sent to your provider as well.   To learn more about what you can do with MyChart, go to [email or web address].    Your next appointment:   3 month(s)  Provider:   Nanetta Batty, MD or APP

## 2023-09-19 NOTE — Progress Notes (Deleted)
Cardiology Office Note:    Date:  09/19/2023   ID:  [name], DOB Dec 22, 1970, MRN [id number]  PCP:  Dolan Amen, FNP  Cardiologist:  Nanetta Batty, MD { Click to update primary MD,subspecialty MD or APP then REFRESH:1}    Referring MD: Dolan Amen, FNP   Chief Complaint: follow-up of CAD  History of Present Illness:    [name] is a 52 y.o. female with a history of CAD with STEMI in 06/2023 (treated medically), post MI pericarditis, hyperlipidemia, and tobacco abuse who is followed by Dr. Allyson Sabal and presents today for routine follow-up.   Patient was first seen by Cardiology during an admission in 06/2023 for an acute STEMI. Emergent cardiac catheterization showed 100% occlusion of distal LAD. It was unclear whether this distal lesion was due to atherosclerosis or SCAD. Medical therapy was recommended. Echo showed LVEF of 55-60% with hypokinesis of the apex, mild LVH, and grade 1 diastolic dysfunction. She continued to have pleuritic chest pain during admission. CRP and Sed Rate were elevated so she was treated for post MI pericarditis with Colchicine. She was last seen by Dorothy Levering, NP, later that month for follow-up at which time she was doing well with no chest pain or shortness of breath. She was advised to continue Colchicine until the beginning of September and then was instructed to stop this  Patient presents today for follow-up. ***  CAD History of STEMI in 06/2023. LHC showed 100% occlusion of distal LAD. It was unclear whether this distal lesion was due to atherosclerosis or SCAD. Medical therapy was recommended.  - No chest pain.  - Continue aspirin, beta-blocker, and statin.   Post MI Pericarditis  Following STEMI in 06/2023.  - No recurrent pleuritic pain.  - She has completed treatment with Colchicine.   Hyperlipidemia Lipid panel in 06/2023 at time of STEMI: Total Cholesterol 178, Triglycerides 98, HDL 36, LDL 122. LDL goal <70 given CAD.   - Continue Crestor 40mg  daily. This was started during admission for STEMI. - Will repeat lipid panel and LFTs. ***  Tobacco Abuse ***  EKGs/Labs/Other Studies Reviewed:    The following studies were reviewed today:  Left Cardiac Catheterization 06/20/2023:   Dist LAD lesion is 100% stenosed.   There is mild left ventricular systolic dysfunction.   LV end diastolic pressure is normal.   The left ventricular ejection fraction is 50-55% by visual estimate.  Impression: [name] had a distal apical LAD occlusion. Is unclear whether this is atherosclerotic or SCAD . She has no atherosclerosis in the remainder of her coronary circulation. Her EF is in the 50% range with apical akinesis. I elected not to intervene and suspect that she will complete her infarct with minimal LV dysfunction. She will need to be on aspirin, high-dose statin. We discussed the importance of smoking cessation. The sheath was removed and a TR band was placed on the right wrist to achieve patent hemostasis. The patient left lab in stable condition.   Diagnostic Dominance: Right  _______________  Echocardiogram 06/20/2023: Impressions:  1. Apex is hypokinetic in the setting of distal LAD STEMI. Left  ventricular ejection fraction, by estimation, is 55 to 60%. The left  ventricle has normal function. There is mild left ventricular hypertrophy.  Left ventricular diastolic parameters are  consistent with Grade I diastolic dysfunction (impaired relaxation).   2. Right ventricular systolic function is normal. The right ventricular  size is normal. There is normal pulmonary artery systolic  pressure.   3. The mitral valve is normal in structure. No evidence of mitral valve  regurgitation.   4. The aortic valve is tricuspid. Aortic valve regurgitation is not  visualized.   5. The inferior vena cava is normal in size with greater than 50%  respiratory variability, suggesting right atrial pressure of 3 mmHg.    EKG:   EKG not ordered today.  Recent Labs: 06/21/2023: ALT 15; Hemoglobin 13.1; Platelets 273 06/23/2023: BUN 18; Creatinine, Ser 1.33; Magnesium 2.4; Potassium 4.2; Sodium 134  Recent Lipid Panel    Component Value Date/Time   CHOL 178 06/21/2023 0541   TRIG 98 06/21/2023 0541   HDL 36 (L) 06/21/2023 0541   CHOLHDL 4.9 06/21/2023 0541   VLDL 20 06/21/2023 0541   LDLCALC 122 (H) 06/21/2023 0541    Physical Exam:    Vital Signs: There were no vitals taken for this visit.    Wt Readings from Last 3 Encounters:  06/30/23 139 lb (63 kg)  06/22/23 141 lb 5 oz (64.1 kg)  08/21/16 160 lb (72.6 kg)     General: 52 y.o. female in no acute distress. HEENT: Normocephalic and atraumatic. Sclera clear.  Neck: Supple. No carotid bruits. No JVD. Heart: *** RRR. Distinct S1 and S2. No murmurs, gallops, or rubs.  Lungs: No increased work of breathing. Clear to ausculation bilaterally. No wheezes, rhonchi, or rales.  Abdomen: Soft, non-distended, and non-tender to palpation.  Extremities: No lower extremity edema.  Radial and distal pedal pulses 2+ and equal bilaterally. Skin: Warm and dry. Neuro: No focal deficits. Psych: Normal affect. Responds appropriately.   Assessment:    No diagnosis found.  Plan:     Disposition: Follow up in ***   Signed, Corrin Parker, PA-C  09/19/2023 2:42 PM    [facility]

## 2023-09-30 ENCOUNTER — Ambulatory Visit: Payer: 59 | Attending: Student | Admitting: Student

## 2024-04-11 ENCOUNTER — Telehealth: Payer: Self-pay | Admitting: Student

## 2024-04-11 NOTE — Telephone Encounter (Signed)
   Name: [name]  DOB: 1971/09/29  MRN: [id number]  Primary Cardiologist: Lauro Portal, MD  Request for Surgical Clearance    Procedure:  breast reduction   Date of Surgery:  Clearance TBD                                  Surgeon:  Gaila Josephs MD Surgeon's Group or Practice Name:  [facility] Phone number:  [phone] Fax number:  [phone]   Type of Clearance Requested:   - Medical   Chart reviewed as part of pre-operative protocol coverage. Because of [name]'s past medical history and time since last visit, she will require a follow-up in-office visit in order to better assess preoperative cardiovascular risk. Patient is overdue for clinic follow up.   Pre-op covering staff: - Please schedule appointment and call patient to inform them. If patient already had an upcoming appointment within acceptable timeframe, please add "pre-op clearance" to the appointment notes so provider is aware. - Please contact requesting surgeon's office via preferred method (i.e, phone, fax) to inform them of need for appointment prior to surgery.  Regarding ASA therapy, we recommend continuation of ASA throughout the perioperative period.    [name], PA-C  04/11/2024, 2:28 PM

## 2024-04-11 NOTE — Telephone Encounter (Signed)
 LVM asking pt to call our office to schedule office visit for preop clearance. Please reach out to our preop team if pt returns call. Thank you

## 2024-04-11 NOTE — Telephone Encounter (Signed)
   Pre-operative Risk Assessment    Patient Name: [name]  DOB: 1971/09/13 MRN: [id number]   Date of last office visit: 06/30/2023 Date of next office visit: n/a   Request for Surgical Clearance    Procedure:  breast reduction  Date of Surgery:  Clearance TBD                                Surgeon:  Gaila Josephs MD Surgeon's Group or Practice Name:  [facility] Phone number:  [phone] Fax number:  [phone]   Type of Clearance Requested:   - Medical    Type of Anesthesia:  Not Indicated   Additional requests/questions:    SignedGenny Kid Schools   04/11/2024, 2:18 PM

## 2024-04-12 NOTE — Telephone Encounter (Signed)
 Spoke with pt regarding preop clearance. Pt stated she switched her cardiology care to [place]. Pt was advise to update her surgeon's office. Pt stated her understanding.

## 2024-06-29 ENCOUNTER — Ambulatory Visit

## 2024-06-29 VITALS — BP 110/78 | HR 97 | Resp 12 | Ht 64.75 in | Wt 140.8 lb

## 2024-06-29 DIAGNOSIS — Z79899 Other long term (current) drug therapy: Secondary | ICD-10-CM

## 2024-06-29 DIAGNOSIS — M0579 Rheumatoid arthritis with rheumatoid factor of multiple sites without organ or systems involvement: Secondary | ICD-10-CM | POA: Diagnosis not present

## 2024-06-29 NOTE — Progress Notes (Unsigned)
 Office Visit Note  Patient: JAEDAN SCHUMAN             Date of Birth: 07/29/1971           MRN: [id number]             PCP: Pura Lenis, MD Referring: Pura Lenis, MD Visit Date: 06/29/2024  Subjective:  New Patient (Initial Visit) (Patient states she was [facility]. Patient states she has tried and failed Methotrexate, Arava , Simponi [facility]. Patient states Remicade has been the only thing that has worked for her. )  History of Present Illness: RENESMAE DONAHEY is a 53 y.o. female who presents today to establish care for Seropositive Rheumatoid Arthritis  Patient admits to dx of RA in 2018 by Dr. Ingrid. She states that her initial manifestation was bilateral foot and ankle pain. She admits to prior use of methotrexate, HCQ, and Humira? prior to starting Remicade which she reports resulted in complete control of her RA. She reports continuing Remicade until ~2021 when she had to switch physicans, which is when she established care with Dr. Strazanac. She was then started on Arava  which she reports did not adequately control her symptoms. She states that she was also supposed to start Simponi however she states that this was never initiated due to insurance issues.   She currently admits to significant joint pain and swelling, especially in her right ankle and right elbow. She admits to 2 hours of AM stiffness.   She admits to hx of breast cancer when she was 53 years old. She also admits to hx of MI in August 2024. 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 She denies any current chest pain or shortness of breath. Admits to smoking approximately 1 pack per week.  Activities of Daily Living:  Patient reports morning stiffness for 1-2 hours.   Patient Reports nocturnal pain.  Difficulty dressing/grooming: Denies Difficulty climbing stairs: Reports Difficulty getting out of chair: Reports Difficulty using hands for taps, buttons,  cutlery, and/or writing: Reports  Review of Systems  Constitutional:  Positive for fatigue.  HENT:  Negative for mouth sores and mouth dryness.   Eyes:  Negative for dryness.  Respiratory:  Negative for shortness of breath.   Cardiovascular:  Negative for chest pain and palpitations.  Gastrointestinal:  Negative for blood in stool, constipation and diarrhea.  Endocrine: Negative for increased urination.  Genitourinary:  Negative for involuntary urination.  Musculoskeletal:  Positive for joint pain, joint pain, joint swelling, morning stiffness and muscle tenderness. Negative for gait problem, myalgias, muscle weakness and myalgias.  Skin:  Negative for color change, rash, hair loss and sensitivity to sunlight.  Allergic/Immunologic: Positive for susceptible to infections.  Neurological:  Negative for dizziness and headaches.  Hematological:  Negative for swollen glands.  Psychiatric/Behavioral:  Positive for sleep disturbance. Negative for depressed mood. The patient is not nervous/anxious.      Rheum History: #RA Diagnosed in 2018.  Manifestation of disease: bilateral foot and ankle pain Previously managed by Dr. Ingrid and then Dr. Strazanac  Serologies: (+) CCP (>250), RF positive  Current Treatment None  Prior Treatments Methotrexate 2018 (d/c due to rash/ineffective?), (ineffective?), Remicade 2018 - 2021/2022 (d/c due to switching providers?), Arava  (ineffective?), Humira (ineffective?)    PMFS History:  Patient Active Problem List   Diagnosis Date Noted   Hyperlipidemia 06/23/2023   Type 2 diabetes mellitus with complication, without long-term current use of insulin  (HCC) 06/23/2023   Acute  ST elevation myocardial infarction (STEMI) of inferior wall (HCC) 06/20/2023   STEMI involving left anterior descending coronary artery (HCC) 06/20/2023   Malignant neoplasm of female breast (HCC) 08/13/2008   Spontaneous abortion 08/13/2008   KNEE PAIN, RIGHT 08/13/2008    BACK PAIN, LUMBAR 08/13/2008    Past Medical History:  Diagnosis Date   Cancer (HCC)    Breast   Heart attack (HCC) 2024   Rheumatoid arthritis (HCC)     Family History  Problem Relation Age of Onset   Diabetes Mother    Hypertension Father    Hypertension Brother    Rheum arthritis Maternal Grandmother    Hypertension Other    Diabetes Other    Vitamin D deficiency Daughter    Past Surgical History:  Procedure Laterality Date   BREAST LUMPECTOMY Left    LEFT HEART CATH AND CORONARY ANGIOGRAPHY N/A 06/20/2023   Procedure: LEFT HEART CATH AND CORONARY ANGIOGRAPHY;  Surgeon: Court Dorn PARAS, MD;  Location: [facility];  Service: Cardiovascular;  Laterality: N/A;   Social History   Social History Narrative   Not on file   Immunization History  Administered Date(s) Administered   Moderna Sars-Covid-2 Vaccination 06/25/2020, 07/23/2020     Objective: Vital Signs: BP 110/78 (BP Location: Right Arm, Patient Position: Sitting, Cuff Size: Small)   Pulse 97   Resp 12   Ht 5' 4.75 (1.645 m)   Wt 140 lb 12.8 oz (63.9 kg)   BMI 23.61 kg/m    Physical Exam Vitals and nursing note reviewed.  Constitutional:      Appearance: She is well-developed.  HENT:     Head: Normocephalic and atraumatic.  Eyes:     Conjunctiva/sclera: Conjunctivae normal.  Cardiovascular:     Rate and Rhythm: Normal rate and regular rhythm.     Heart sounds: Normal heart sounds.  Pulmonary:     Effort: Pulmonary effort is normal.     Breath sounds: Normal breath sounds.  Abdominal:     General: Bowel sounds are normal.     Palpations: Abdomen is soft.  Musculoskeletal:     Cervical back: Normal range of motion.  Skin:    General: Skin is warm and dry.     Capillary Refill: Capillary refill takes less than 2 seconds.  Neurological:     Mental Status: She is alert and oriented to person, place, and time.  Psychiatric:        Mood and Affect: Affect is tearful.        Behavior:  Behavior normal.      Musculoskeletal Exam: CDAI Exam: CDAI Score: 30.5  Patient Global: 85 / 100; Provider Global: 80 / 100 Swollen: 10 ; Tender: 10  Joint Exam 06/29/2024      Right  Left  Wrist  Swollen Tender     CMC  Swollen Tender     MCP 1  Swollen Tender     MCP 2  Swollen Tender     MCP 3  Swollen Tender     MCP 4  Swollen Tender     MCP 5  Swollen Tender     PIP 4 (finger)  Swollen Tender     Ankle  Swollen Tender     Subtalar  Swollen Tender        Investigation: No additional findings.  Imaging: No results found.  Recent Labs: Lab Results  Component Value Date   WBC 10.6 06/29/2024   HGB 14.9 06/29/2024   PLT  328 06/29/2024   NA 136 06/29/2024   K 4.6 06/29/2024   CL 106 06/29/2024   CO2 19 (L) 06/29/2024   GLUCOSE 72 06/29/2024   BUN 14 06/29/2024   CREATININE 1.24 (H) 06/29/2024   BILITOT 0.4 06/29/2024   ALKPHOS 86 06/21/2023   AST 12 06/29/2024   ALT 7 06/29/2024   PROT 7.5 06/29/2024   ALBUMIN 3.3 (L) 06/21/2023   CALCIUM  9.7 06/29/2024    Speciality Comments: B/P in Right arm only  Procedures:  No procedures performed Allergies: Benadryl [diphenhydramine], Depen [penicillamine], Jardiance [empagliflozin], Vibra-tab [doxycycline], Z-pak [azithromycin], Amoxil [amoxicillin], Glucophage [metformin], Glucotrol [glipizide], Lipitor [atorvastatin ], and Prednisone    Assessment / Plan:     Visit Diagnoses: Rheumatoid arthritis involving multiple sites with positive rheumatoid factor (HCC)  Patient with seropositive RA, currently not on any DMARD therapy, with evidence of active disease on examination today (CDAI 30). Given prior hx of controlled disease on Infliximab, patient wishes to resume treatment with this medication. After discussion with patient, will initiate Infliximab 3 mg/kg at 0, 2, and 6 weeks then every 8 weeks thereafter.   Side effects reviewed with patient including headache, rash, positive ANA, drug-induced lupus, antibody  development, infection, injection site reaction, increased CPK, URI, and sinusitis amongst others. Also discussed risk of new-onset/worsening HF. QuantiFERON and hepatitis will be checked today, as will baseline CBC and CMP. Handout given to patient to review and consent obtained. Patient advised they will need blood tests for liver function, kidney function, and blood counts every three months while taking this medication.  Given risk for antibody formation, will also re-start Arava  (cannot use methotrexate due to prior hx of rash/decreased renal function). Patient agreeable to restart medication at this time. After discussion with patient, will initiate Arava  10mg  daily x 2 weeks, after which will obtain repeat CBC and CMP. If stable, will increase to 20mg  daily.    Discussed with patient that the most common side effect of leflunomide  is diarrhea, which typically improves with time. Less common side effects include nausea, stomach pain, indigestion, rash, and hair loss. In fewer than 10% of patients, leflunomide  can cause abnormal liver function tests or decreased blood cell or platelet counts. Rarely, this drug may cause lung problems, such as cough, shortness of breath or lung injury. Leflunomide  lowers immunity and increases risk of infections. Patient was advised to hold medication if significantly ill or on an antibiotic.  Discussed that alcohol use should be avoided when taking lefunomide as this may increase the risk of liver damage. Also discussed that pregnant women or those wishing to become pregnant should not take Leflunomide , as it can cause birth defects. Birth control should be used in women of child-bearing age while taking leflunomide .   Furthermore, given evidence of synovitis on exam today, will also provide patient with 12 day prednisone  taper. Patient reports prior use of prednisone  without issue (admits to possible rash after use years ago, however denies issue with subsequent  doses). Discussed steroid side effects including weight gain, decreased bone density/fractures, risk for infection, avascular necrosis, hypertension, hyperglycemia, dyspepsia/gastric ulcers, fluid retention, weakness, bruising, acne, cataracts, insomnia, mood problems. Patient also instructed to take this medication with food, and not to take this medication with NSAIDs such as Aleve or Ibuprofen .    #High risk medication use Patient starting Arava  and Inflixmab for Seropositive RA, will obtain labs after 2 weeks of initiating Arava , then every 3 months as discussed above.    Orders: Orders Placed This Encounter  Procedures   Sedimentation rate   C-reactive protein   Hepatitis B surface antigen   Hepatitis B core antibody, IgM   Hepatitis C antibody   QuantiFERON-TB Gold Plus   CBC with Differential/Platelet   Comprehensive metabolic panel with GFR   No orders of the defined types were placed in this encounter.   Face-to-face time spent with patient was 60 minutes. Greater than 50% of time was spent in counseling and coordination of care.  Follow-Up Instructions: Return in about 3 months (around 09/29/2024).   [name], DO

## 2024-06-29 NOTE — Patient Instructions (Signed)
 Leflunomide  Tablets What is this medication? LEFLUNOMIDE  (le FLOO na mide) treats the symptoms of rheumatoid arthritis. It works by slowing down an overactive immune system. This decreases inflammation. It belongs to a group of medications called DMARDs. This medicine may be used for other purposes; ask your health care provider or pharmacist if you have questions. COMMON BRAND NAME(S): Arava  What should I tell my care team before I take this medication? They need to know if you have any of these conditions: Cancer Diabetes High blood pressure Immune system problems Infection Kidney disease Liver disease Low blood cell levels (white cells, red cells, and platelets) Lung or breathing disease, such as asthma or COPD Recent or upcoming vaccine Skin conditions Tingling of the fingers or toes, or other nerve disorder An unusual or allergic reaction to leflunomide , other medications, food, dyes, or preservatives Pregnant or trying to get pregnant Breastfeeding How should I use this medication? Take this medication by mouth with a full glass of water. Take it as directed on the prescription label at the same time every day. Keep taking it unless your care team tells you to stop. Talk to your care team about the use of this medication in children. Special care may be needed. Overdosage: If you think you have taken too much of this medicine contact a poison control center or emergency room at once. NOTE: This medicine is only for you. Do not share this medicine with others. What if I miss a dose? If you miss a dose, take it as soon as you can. If it is almost time for your next dose, take only that dose. Do not take double or extra doses. What may interact with this medication? Do not take this medication with any of the following: Teriflunomide This medication may also interact with the following: Alosetron Caffeine Cefaclor Certain medications for diabetes, such as nateglinide,  repaglinide, rosiglitazone, pioglitazone Certain medications for high cholesterol, such as atorvastatin , pravastatin, rosuvastatin , simvastatin Charcoal Cholestyramine Ciprofloxacin Duloxetine Estrogen and progestin hormones Furosemide Ketoprofen Live virus vaccines Medications that increase your risk for infection Methotrexate Mitoxantrone Paclitaxel Penicillin Theophylline Tizanidine Warfarin This list may not describe all possible interactions. Give your health care provider a list of all the medicines, herbs, non-prescription drugs, or dietary supplements you use. Also tell them if you smoke, drink alcohol, or use illegal drugs. Some items may interact with your medicine. What should I watch for while using this medication? Visit your care team for regular checks on your progress. Tell your care team if your symptoms do not start to get better or if they get worse. You may need blood work done while you are taking this medication. This medication may cause serious skin reactions. They can happen weeks to months after starting the medication. Contact your care team right away if you notice fevers or flu-like symptoms with a rash. The rash may be red or purple and then turn into blisters or peeling of the skin. You may also notice a red rash with swelling of the face, lips, or lymph nodes in your neck or under your arms. You should not receive certain vaccines during your treatment and for a certain time after your treatment with this medication ends. Talk to your care team for more information. This medication may stay in your body for up to 2 years after your last dose. Tell your care team about any unusual side effects or symptoms. A medication can be given to help lower your blood levels of this  medication more quickly. Talk to your care team if you may be pregnant. This medication can cause serious birth defects if taken during pregnancy and for a while after the last dose. You will  need a negative pregnancy test before starting this medication. Contraception is recommended while taking this medication and for a while after the last dose. Your care team can help you find the option that works for you. Do not breastfeed while taking this medication. What side effects may I notice from receiving this medication? Side effects that you should report to your care team as soon as possible: Allergic reactions--skin rash, itching, hives, swelling of the face, lips, tongue, or throat Dry cough, shortness of breath or trouble breathing Increase in blood pressure Infection--fever, chills, cough, sore throat, wounds that don't heal, pain or trouble when passing urine, general feeling of discomfort or being unwell Redness, blistering, peeling, or loosening of the skin, including inside the mouth Liver injury--right upper belly pain, loss of appetite, nausea, light-colored stool, dark yellow or brown urine, yellowing skin or eyes, unusual weakness or fatigue Pain, tingling, or numbness in the hands or feet Unusual bruising or bleeding Side effects that usually do not require medical attention (report to your care team if they continue or are bothersome): Back pain Diarrhea Hair loss Headache Nausea This list may not describe all possible side effects. Call your doctor for medical advice about side effects. You may report side effects to FDA at 1-800-FDA-1088. Where should I keep my medication? Keep out of the reach of children and pets. Store at room temperature between 20 and 25 degrees C (68 and 77 degrees F). Protect from moisture and light. Keep the container tightly closed. Get rid of any unused medication after the expiration date. To get rid of medications that are no longer needed or have expired: Take the medication to a medication take-back program. Check with your pharmacy or law enforcement to find a location. If you cannot return the medication, ask your pharmacist or  care team how to get rid of this medication safely. NOTE: This sheet is a summary. It may not cover all possible information. If you have questions about this medicine, talk to your doctor, pharmacist, or health care provider.  2024 Elsevier/Gold Standard (2022-04-01 00:00:00)Infliximab Injection What is this medication? INFLIXIMAB (in FLIX i mab) treats autoimmune conditions, such as psoriasis, arthritis, Crohn's disease, and ulcerative colitis. It works by slowing down an overactive immune system. It belongs to a group of medications called TNF inhibitors. It is a monoclonal antibody. This medicine may be used for other purposes; ask your health care provider or pharmacist if you have questions. COMMON BRAND NAME(S): AVSOLA, INFLECTRA, IXIFI, Remicade, RENFLEXIS, Zymfentra What should I tell my care team before I take this medication? They need to know if you have any of these conditions: Cancer Current or past resident of [place]  or [place] Diabetes Exposure to tuberculosis Guillain-Barre syndrome Heart failure Liver disease Immune system problems Infection Lung or breathing disease, such as COPD Multiple sclerosis Receiving phototherapy for the skin Seizure disorder An unusual or allergic reaction to infliximab, mouse proteins, other medications, foods, dyes, or preservatives Pregnant or trying to get pregnant Breast-feeding How should I use this medication? This medication is injected into a vein or under the skin. If it is injected into a vein, it is given by your care team in a hospital or clinic setting. If it is injected under the skin, it may be given at  home. If you get this medication at home, you will be taught how to prepare and give it. Use exactly as directed. Take it as directed on the prescription label at the same time every day. Keep taking it unless your care team tells you to stop. It is important that you put your used needles and syringes in a  special sharps container. Do not put them in a trash can. If you do not have a sharps container, call your pharmacist or care team to get one. A special MedGuide will be given to you by the pharmacist with each prescription and refill. Be sure to read this information carefully each time. Talk to your care team about the use of this medication in children. While it may be prescribed for children as young as 32 years of age for selected conditions, precautions do apply. Overdosage: If you think you have taken too much of this medicine contact a poison control center or emergency room at once. NOTE: This medicine is only for you. Do not share this medicine with others. What if I miss a dose? If you get this medication at the hospital or clinic: It is important not to miss your dose. Call your care team if you are unable to keep an appointment. If you give yourself this medication at home: If you miss a dose, take it as soon as you can. Then give the next dose 2 weeks later and continue your normal schedule. If it is almost time for your next dose, take only that dose. Do not take double or extra doses. Call your care team with questions. What may interact with this medication? Do not take this medication with any of the following: Biologic medications, such as abatacept, adalimumab, anakinra, certolizumab, etanercept, golimumab, rituximab, secukinumab, tocilizumab, tofactinib, ustekinumab Live vaccines This list may not describe all possible interactions. Give your health care provider a list of all the medicines, herbs, non-prescription drugs, or dietary supplements you use. Also tell them if you smoke, drink alcohol, or use illegal drugs. Some items may interact with your medicine. What should I watch for while using this medication? Visit your care team for regular checks on your progress. Your condition will be monitored carefully while you are receiving this medication. You may need blood work done  while you are taking this medication. You will be tested for tuberculosis (TB) before you start this medication. If your care team prescribes any medication for TB, you should start taking the TB medication before starting this medication. Make sure to finish the full course of TB medication. This medication may increase your risk of getting an infection. Call your care team for advice if you get a fever, chills, sore throat, or other symptoms of a cold or flu. Do not treat yourself. Try to avoid being around people who are sick. This medication may make the symptoms of heart failure worse in some patients. Contact your care team right away if you develop signs or symptoms of heart failure. Before having surgery or dental work, talk to your care team to make sure it is ok. This medication can increase the risk of poor healing of your surgical site or wound. If you take this medication for plaque psoriasis, stay out of the sun. If you cannot avoid being in the sun, wear protective clothing and sunscreen. Do not use sun lamps or tanning beds/booths. Talk to your care team about your risk of cancer. You may be more at risk for certain  types of cancers if you take this medication. What side effects may I notice from receiving this medication? Side effects that you should report to your care team as soon as possible: Allergic reactions--skin rash, itching, hives, swelling of the face, lips, tongue, or throat Body pain, tingling, or numbness Heart attack--pain or tightness in the chest, shoulders, arms, or jaw, nausea, shortness of breath, cold or clammy skin, feeling faint or lightheaded Heart failure--shortness of breath, swelling of the ankles, feet, or hands, sudden weight gain, unusual weakness or fatigue Heart rhythm changes--fast or irregular heartbeat, dizziness, feeling faint or lightheaded, chest pain, trouble breathing Increase in blood pressure Infection--fever, chills, cough, sore throat,  wounds that don't heal, pain or trouble when passing urine, general feeling of discomfort or being unwell Liver injury--right upper belly pain, loss of appetite, nausea, light-colored stool, dark yellow or brown urine, yellowing skin or eyes, unusual weakness or fatigue Low blood pressure--dizziness, feeling faint or lightheaded, blurry vision Lupus-like syndrome--joint pain, swelling, or stiffness, butterfly-shaped rash on the face, rashes that get worse in the sun, fever, unusual weakness or fatigue Seizures Stroke--sudden numbness or weakness of the face, arm, or leg, trouble speaking, confusion, trouble walking, loss of balance or coordination, dizziness, severe headache, change in vision Sudden vision loss in one or both eyes Unusual bruising or bleeding Side effects that usually do not require medical attention (report to your care team if they continue or are bothersome): Cough Diarrhea Fatigue Headache Nausea Runny or stuffy nose Sore throat Stomach pain This list may not describe all possible side effects. Call your doctor for medical advice about side effects. You may report side effects to FDA at 1-800-FDA-1088. Where should I keep my medication? Keep out of the reach of children and pets. See product for storage information. Each product may have different instructions. Get rid of any unused medication after the expiration date. To get rid of medications that are no longer needed or have expired: Take the medication to a medication take-back program. Check with your pharmacy or law enforcement to find a location. If you cannot return the medication, ask your pharmacist or care team how to get rid of this medication safely. NOTE: This sheet is a summary. It may not cover all possible information. If you have questions about this medicine, talk to your doctor, pharmacist, or health care provider.  2024 Elsevier/Gold Standard (2022-10-27 00:00:00)

## 2024-07-02 ENCOUNTER — Ambulatory Visit: Payer: Self-pay

## 2024-07-02 ENCOUNTER — Other Ambulatory Visit: Payer: Self-pay | Admitting: Pharmacist

## 2024-07-02 ENCOUNTER — Telehealth: Payer: Self-pay

## 2024-07-02 DIAGNOSIS — Z79899 Other long term (current) drug therapy: Secondary | ICD-10-CM

## 2024-07-02 DIAGNOSIS — Z111 Encounter for screening for respiratory tuberculosis: Secondary | ICD-10-CM

## 2024-07-02 DIAGNOSIS — M0579 Rheumatoid arthritis with rheumatoid factor of multiple sites without organ or systems involvement: Secondary | ICD-10-CM

## 2024-07-02 MED ORDER — PREDNISONE 10 MG PO TABS: ORAL_TABLET | ORAL | 0 refills | Status: AC

## 2024-07-02 MED ORDER — LEFLUNOMIDE 10 MG PO TABS: ORAL_TABLET | ORAL | 0 refills | Status: AC

## 2024-07-02 NOTE — Progress Notes (Signed)
 Patient restarting Remicade. Referral placed to [facility] Diagnosis: RA; also on leflunomide  10mg  daily  Dose: 3 mg/kg at 0, 2, and 6 weeks then every 8 weeks thereafter  Last Clinic Visit: 06/29/2024 Next Clinic Visit: not scheduled; due 6-8 after first infusion  Last infusion: 2021  Labs: CBC, CMP, hepatitis screening on 06/29/2024 TB Gold: negative on 06/29/2024   Orders placed for Remicade (J1745) x 3 doses along with premedication of acetaminophen  650mg  p.o., diphenhydramine 25mg  p.o., and Solumedrol 60mg  IV (steroids with first infusion only) to be administered 30 minutes before medication infusion.  Standing CBC with diff/platelet and CMP with GFR orders placed to be drawn at 6 weeks, then 2 months.  Next TB gold due August 2026  Sherry Pennant, PharmD, MPH, BCPS, CPP Clinical Pharmacist (Rheumatology and Pulmonology)

## 2024-07-02 NOTE — Telephone Encounter (Signed)
 Patient left a VM stating that she received a message that her labs are in but don't have MyChart to view them. Patient request a call back to discuss her labs.

## 2024-07-03 ENCOUNTER — Telehealth: Payer: Self-pay

## 2024-07-03 DIAGNOSIS — Z111 Encounter for screening for respiratory tuberculosis: Secondary | ICD-10-CM | POA: Insufficient documentation

## 2024-07-03 DIAGNOSIS — Z79899 Other long term (current) drug therapy: Secondary | ICD-10-CM | POA: Insufficient documentation

## 2024-07-03 DIAGNOSIS — M0579 Rheumatoid arthritis with rheumatoid factor of multiple sites without organ or systems involvement: Secondary | ICD-10-CM | POA: Insufficient documentation

## 2024-07-03 LAB — COMPREHENSIVE METABOLIC PANEL WITH GFR
AG Ratio: 1.3 (calc) (ref 1.0–2.5)
ALT: 7 U/L (ref 6–29)
AST: 12 U/L (ref 10–35)
Albumin: 4.3 g/dL (ref 3.6–5.1)
Alkaline phosphatase (APISO): 102 U/L (ref 37–153)
BUN/Creatinine Ratio: 11 (calc) (ref 6–22)
BUN: 14 mg/dL (ref 7–25)
CO2: 19 mmol/L — ABNORMAL LOW (ref 20–32)
Calcium: 9.7 mg/dL (ref 8.6–10.4)
Chloride: 106 mmol/L (ref 98–110)
Creat: 1.24 mg/dL — ABNORMAL HIGH (ref 0.50–1.03)
Globulin: 3.2 g/dL (ref 1.9–3.7)
Glucose, Bld: 72 mg/dL (ref 65–99)
Potassium: 4.6 mmol/L (ref 3.5–5.3)
Sodium: 136 mmol/L (ref 135–146)
Total Bilirubin: 0.4 mg/dL (ref 0.2–1.2)
Total Protein: 7.5 g/dL (ref 6.1–8.1)
eGFR: 53 mL/min/1.73m2 — ABNORMAL LOW

## 2024-07-03 LAB — QUANTIFERON-TB GOLD PLUS
Mitogen-NIL: >10 [IU]/mL
NIL: 0.09 [IU]/mL
QuantiFERON-TB Gold Plus: NEGATIVE
TB1-NIL: <0 [IU]/mL
TB2-NIL: <0 [IU]/mL

## 2024-07-03 LAB — CBC WITH DIFFERENTIAL/PLATELET
Absolute Lymphocytes: 4272 {cells}/uL — ABNORMAL HIGH (ref 850–3900)
Absolute Monocytes: 329 {cells}/uL (ref 200–950)
Basophils Absolute: 95 {cells}/uL (ref 0–200)
Basophils Relative: 0.9 %
Eosinophils Absolute: 201 {cells}/uL (ref 15–500)
Eosinophils Relative: 1.9 %
HCT: 45.5 % — ABNORMAL HIGH (ref 35.0–45.0)
Hemoglobin: 14.9 g/dL (ref 11.7–15.5)
MCH: 30.2 pg (ref 27.0–33.0)
MCHC: 32.7 g/dL (ref 32.0–36.0)
MCV: 92.1 fL (ref 80.0–100.0)
MPV: 10.1 fL (ref 7.5–12.5)
Monocytes Relative: 3.1 %
Neutro Abs: 5703 {cells}/uL (ref 1500–7800)
Neutrophils Relative %: 53.8 %
Platelets: 328 Thousand/uL (ref 140–400)
RBC: 4.94 Million/uL (ref 3.80–5.10)
RDW: 14.7 % (ref 11.0–15.0)
Total Lymphocyte: 40.3 %
WBC: 10.6 Thousand/uL (ref 3.8–10.8)

## 2024-07-03 LAB — HEPATITIS B SURFACE ANTIGEN: Hepatitis B Surface Ag: NONREACTIVE

## 2024-07-03 LAB — C-REACTIVE PROTEIN: CRP: 30.2 mg/L — ABNORMAL HIGH (ref <8.0)

## 2024-07-03 LAB — HEPATITIS B CORE ANTIBODY, IGM: Hep B C IgM: NONREACTIVE

## 2024-07-03 LAB — SEDIMENTATION RATE: Sed Rate: 36 mm/h — ABNORMAL HIGH (ref 0–30)

## 2024-07-03 LAB — HEPATITIS C ANTIBODY: Hepatitis C Ab: NONREACTIVE

## 2024-07-03 NOTE — Telephone Encounter (Signed)
 Hello,  Would you like to switch to the preferred medication Avsola?   Auth Submission: DENIED Site of care: Site of care: [facility] Payer: [facility] Medication & CPT/J Code(s) submitted: Remicade (Infliximab) J1745 Diagnosis Code:  Route of submission (phone, fax, portal): portal Phone # Fax # Auth type: Buy/Bill PB Units/visits requested:  Reference number:     Authorization has been DENIED because Remicade is not a preferred medication.

## 2024-07-03 NOTE — Progress Notes (Signed)
 Remicade denied. Switched to Avsola  Referral placed to [facility] Diagnosis: RA; also on leflunomide  10mg  daily   Dose: 3 mg/kg at 0, 2, and 6 weeks then every 8 weeks thereafter   Last Clinic Visit: 06/29/2024 Next Clinic Visit: not scheduled; due 6-8 after first infusion   Last infusion: 2021   Labs: CBC, CMP, hepatitis screening on 06/29/2024 TB Gold: negative on 06/29/2024    Orders placed for Remicade (J1745) x 3 doses along with premedication of acetaminophen  650mg  p.o., cetirizine  10mg  p.o., and Solumedrol 60mg  IV (steroids with first infusion only) to be administered 30 minutes before medication infusion. She is allergic to diphenhydramine   Standing CBC with diff/platelet and CMP with GFR orders placed to be drawn at 6 weeks, then 2 months.  Next TB gold due August 2026   Sherry Pennant, PharmD, MPH, BCPS, CPP Clinical Pharmacist (Rheumatology and Pulmonology)

## 2024-07-03 NOTE — Telephone Encounter (Signed)
 Hello,  Patient will be scheduled as soon as possible.  Auth Submission: NO AUTH NEEDED Site of care: Site of care: [facility] Payer: [facility] dual complete Medication & CPT/J Code(s) submitted: Avsola (infliximab-axxq) [id number] Diagnosis Code:  Route of submission (phone, fax, portal): portal Phone # Fax # Auth type: Buy/Bill PB Units/visits requested: 3mg /kg, initial dose then q8 weeks Reference number: [id number] Approval from: 07/03/24 to 11/14/24

## 2024-07-03 NOTE — Addendum Note (Signed)
 Addended by: DAYNE SHERRY RAMAN on: 07/03/2024 12:13 PM   Modules accepted: Orders

## 2024-07-03 NOTE — Telephone Encounter (Signed)
 Will place order for Avsola

## 2024-07-06 NOTE — Progress Notes (Signed)
 First Avsola infusion scheduled for 07/10/24

## 2024-07-10 ENCOUNTER — Encounter: Admitting: Emergency Medicine

## 2024-07-10 VITALS — BP 121/79 | HR 78 | Temp 97.6°F | Resp 15

## 2024-07-10 DIAGNOSIS — Z79899 Other long term (current) drug therapy: Secondary | ICD-10-CM

## 2024-07-10 DIAGNOSIS — M0579 Rheumatoid arthritis with rheumatoid factor of multiple sites without organ or systems involvement: Secondary | ICD-10-CM | POA: Diagnosis not present

## 2024-07-10 DIAGNOSIS — Z111 Encounter for screening for respiratory tuberculosis: Secondary | ICD-10-CM

## 2024-07-10 MED ORDER — METHYLPREDNISOLONE SODIUM SUCC 125 MG IJ SOLR
60.0000 mg | Freq: Once | INTRAMUSCULAR | Status: AC
  Administered 2024-07-10: 60 mg via INTRAVENOUS

## 2024-07-10 MED ORDER — LORATADINE 10 MG PO TABS
10.0000 mg | ORAL_TABLET | Freq: Every day | ORAL | Status: DC
  Administered 2024-07-10: 10 mg via ORAL

## 2024-07-10 MED ORDER — SODIUM CHLORIDE 0.9 % IV SOLN
3.0000 mg/kg | Freq: Once | INTRAVENOUS | Status: AC
  Administered 2024-07-10: 200 mg via INTRAVENOUS
  Filled 2024-07-10: qty 20

## 2024-07-10 MED ORDER — ACETAMINOPHEN 325 MG PO TABS
650.0000 mg | ORAL_TABLET | Freq: Once | ORAL | Status: AC
  Administered 2024-07-10: 650 mg via ORAL

## 2024-07-10 NOTE — Progress Notes (Signed)
 Diagnosis: Rheumatoid Arthritis  Provider:  Luba Stabs, DO   Procedure: IV Infusion  IV Type: Peripheral, IV Location: R Wrist   Avsola  (infliximab -axxq), Dose: 200 mg  Infusion Start Time: 1000  Infusion Stop Time: 1210  Post Infusion IV Care: Observation period completed and Peripheral IV Discontinued  Discharge: Condition: Good, Destination: Home . AVS Provided  Performed by:  Delon ONEIDA Officer, RN

## 2024-07-11 NOTE — Addendum Note (Signed)
 Addended by: DAYNE SHERRY RAMAN on: 07/11/2024 02:12 PM   Modules accepted: Orders

## 2024-07-24 ENCOUNTER — Ambulatory Visit

## 2024-07-25 ENCOUNTER — Emergency Department (HOSPITAL_COMMUNITY)

## 2024-07-25 ENCOUNTER — Encounter (HOSPITAL_COMMUNITY): Payer: Self-pay | Admitting: Emergency Medicine

## 2024-07-25 ENCOUNTER — Telehealth: Payer: Self-pay

## 2024-07-25 ENCOUNTER — Encounter: Admitting: Emergency Medicine

## 2024-07-25 ENCOUNTER — Emergency Department (HOSPITAL_COMMUNITY)
Admission: EM | Admit: 2024-07-25 | Discharge: 2024-07-25 | Disposition: A | Discharge status: Home / Self Care | Source: Other Acute Inpatient Hospital | Attending: Emergency Medicine | Admitting: Emergency Medicine

## 2024-07-25 ENCOUNTER — Other Ambulatory Visit: Payer: Self-pay

## 2024-07-25 VITALS — BP 145/109 | HR 77 | Temp 98.0°F | Resp 20 | Wt 140.0 lb

## 2024-07-25 DIAGNOSIS — T451X5A Adverse effect of antineoplastic and immunosuppressive drugs, initial encounter: Secondary | ICD-10-CM | POA: Insufficient documentation

## 2024-07-25 DIAGNOSIS — R079 Chest pain, unspecified: Secondary | ICD-10-CM

## 2024-07-25 DIAGNOSIS — Z79899 Other long term (current) drug therapy: Secondary | ICD-10-CM | POA: Insufficient documentation

## 2024-07-25 DIAGNOSIS — M0579 Rheumatoid arthritis with rheumatoid factor of multiple sites without organ or systems involvement: Secondary | ICD-10-CM | POA: Insufficient documentation

## 2024-07-25 DIAGNOSIS — E119 Type 2 diabetes mellitus without complications: Secondary | ICD-10-CM | POA: Diagnosis not present

## 2024-07-25 DIAGNOSIS — Z7982 Long term (current) use of aspirin: Secondary | ICD-10-CM | POA: Diagnosis not present

## 2024-07-25 DIAGNOSIS — Z111 Encounter for screening for respiratory tuberculosis: Secondary | ICD-10-CM | POA: Insufficient documentation

## 2024-07-25 DIAGNOSIS — T8090XA Unspecified complication following infusion and therapeutic injection, initial encounter: Secondary | ICD-10-CM

## 2024-07-25 DIAGNOSIS — Z7901 Long term (current) use of anticoagulants: Secondary | ICD-10-CM | POA: Diagnosis not present

## 2024-07-25 LAB — BASIC METABOLIC PANEL WITH GFR
Anion gap: 9 (ref 5–15)
BUN: 6 mg/dL (ref 6–20)
CO2: 20 mmol/L — ABNORMAL LOW (ref 22–32)
Calcium: 8.2 mg/dL — ABNORMAL LOW (ref 8.9–10.3)
Chloride: 108 mmol/L (ref 98–111)
Creatinine, Ser: 0.97 mg/dL (ref 0.44–1.00)
GFR, Estimated: >60 mL/min (ref >60)
Glucose, Bld: 144 mg/dL — ABNORMAL HIGH (ref 70–99)
Potassium: 3.6 mmol/L (ref 3.5–5.1)
Sodium: 137 mmol/L (ref 135–145)

## 2024-07-25 LAB — CBC
HCT: 37.7 % (ref 36.0–46.0)
Hemoglobin: 12.4 g/dL (ref 12.0–15.0)
MCH: 30.8 pg (ref 26.0–34.0)
MCHC: 32.9 g/dL (ref 30.0–36.0)
MCV: 93.8 fL (ref 80.0–100.0)
Platelets: 326 K/uL (ref 150–400)
RBC: 4.02 MIL/uL (ref 3.87–5.11)
RDW: 14.3 % (ref 11.5–15.5)
WBC: 9.5 K/uL (ref 4.0–10.5)
nRBC: 0 % (ref 0.0–0.2)

## 2024-07-25 LAB — TROPONIN I (HIGH SENSITIVITY)
Troponin I (High Sensitivity): 3 ng/L (ref <18)
Troponin I (High Sensitivity): 3 ng/L (ref <18)

## 2024-07-25 MED ORDER — SODIUM CHLORIDE 0.9 % IV SOLN: Freq: Once | INTRAVENOUS | Status: AC | PRN

## 2024-07-25 MED ORDER — METHYLPREDNISOLONE SODIUM SUCC 125 MG IJ SOLR
125.0000 mg | Freq: Once | INTRAMUSCULAR | Status: AC | PRN
  Administered 2024-07-25: 125 mg via INTRAVENOUS

## 2024-07-25 MED ORDER — SODIUM CHLORIDE 0.9 % IV SOLN
3.0000 mg/kg | Freq: Once | INTRAVENOUS | Status: AC
  Administered 2024-07-25: 200 mg via INTRAVENOUS
  Filled 2024-07-25: qty 20

## 2024-07-25 MED ORDER — FAMOTIDINE IN NACL 20-0.9 MG/50ML-% IV SOLN
20.0000 mg | Freq: Once | INTRAVENOUS | Status: AC | PRN
  Administered 2024-07-25: 20 mg via INTRAVENOUS

## 2024-07-25 MED ORDER — ACETAMINOPHEN 325 MG PO TABS
650.0000 mg | ORAL_TABLET | Freq: Once | ORAL | Status: AC
  Administered 2024-07-25: 650 mg via ORAL

## 2024-07-25 MED ORDER — LORATADINE 10 MG PO TABS
10.0000 mg | ORAL_TABLET | Freq: Every day | ORAL | Status: DC
  Administered 2024-07-25: 10 mg via ORAL

## 2024-07-25 NOTE — Progress Notes (Signed)
 Diagnosis: Rheumatoid Arthritis  Provider:  Luba Stabs, DO   Procedure: IV Infusion  IV Type: Peripheral, IV Location: R Hand  Avsola  (infliximab -axxq), Dose: 200 mg  Infusion Start Time: 1010  Infusion Stop Time: 1130 Stopped due to having chest pain radiating to back pain.  Post Infusion IV Care: EMS called, pt taken to [facility]  Discharge: Condition: Fair, Destination: [facility] . AVS Declined  Performed by:  Delon ONEIDA Officer, RN

## 2024-07-25 NOTE — Discharge Instructions (Signed)
 You were seen for your chest pain in the emergency department.  It was likely due to an infusion reaction to the infliximab   Follow-up with your primary doctor in 2-3 days regarding your visit.    Return immediately to the emergency department if you experience any of the following: recurrent chest pain, or any other concerning symptoms.    Thank you for visiting our Emergency Department. It was a pleasure taking care of you today.

## 2024-07-25 NOTE — ED Triage Notes (Signed)
 Pt receiving Azola medication from infusion clinic across street. This was 2nd infusion and started having chest pain and back pain. Arrived a/o. Nad. Was given solumedrol and pepcid  at the clinic

## 2024-07-25 NOTE — ED Provider Notes (Signed)
 [place] [facility]ERGENCY DEPARTMENT AT [facility] Provider Note   CSN: [id number] Arrival date & time: 07/25/24  1217     Patient presents with: Allergic Reaction   [name] is a 53 y.o. female.  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  Patient was getting and infusion of infliximab  when she started experiencing chest pain that felt like a cramping radiating to her back.  Also felt very anxious and uncomfortable started having a panic attack.  Her vitals were reportedly normal.  No diaphoresis, rash, vomiting, diarrhea, or lightheadedness.  Was given Solu-Medrol  and Pepcid  prior to arrival.  Says that her symptoms have completely resolved and she is feeling back to her baseline now       Prior to Admission medications   Medication Sig Start Date End Date Taking? Authorizing Provider  albuterol (VENTOLIN HFA) 108 (90 Base) MCG/ACT inhaler INHALE 2 PUFFS BY MOUTH EVERY 6 HOURS AS NEEDED FOR WHEEZING    [provider]  apixaban (ELIQUIS) 5 MG TABS tablet Take 5 mg by mouth. 05/30/24   [provider]  aspirin  EC 81 MG tablet Take 81 mg by mouth daily.    [provider]  atorvastatin  (LIPITOR) 40 MG tablet Take 40 mg by mouth daily.    [provider]  buPROPion (WELLBUTRIN XL) 150 MG 24 hr tablet Take 150 mg by mouth daily. 06/28/23   [provider]  colchicine  0.6 MG tablet Take 1 tablet (0.6 mg total) by mouth 2 (two) times daily. Patient not taking: Reported on 06/29/2024 06/23/23   Henry Shaver B, NP  Dulaglutide (TRULICITY) 0.75 MG/0.5ML SOPN Inject 0.75 mg into the skin every Sunday.    [provider]  escitalopram (LEXAPRO) 20 MG tablet Take 20 mg by mouth daily. Patient  not taking: Reported on 06/29/2024 06/28/23   [provider]  fluticasone (FLONASE) 50 MCG/ACT nasal spray Place 2 sprays into both nostrils daily as needed for allergies.    [provider]  folic acid (FOLVITE) 1 MG tablet Take 1 mg by mouth daily.    [provider]  inFLIXimab -axxq (AVSOLA  IV) Infuse 3 mg/kg at 0, 2, and 6 weeks then every 8 weeks thereafter    [provider]  leflunomide  (ARAVA ) 10 MG tablet Take 1 tablet (10 mg) po daily x 2 weeks. If labs are stable, take 2 tablets (20 mg) daily. 07/02/24   Szer, Asberry, DO  metoprolol  tartrate (LOPRESSOR ) 25 MG tablet Take 1 tablet (25 mg total) by mouth 2 (two) times daily. 06/23/23   Henry Shaver NOVAK, NP  nitroGLYCERIN  (NITROSTAT ) 0.4 MG SL tablet Place 1 tablet (0.4 mg total) under the tongue every 5 (five) minutes x 3 doses as needed for chest pain. 06/23/23   Henry Shaver NOVAK, NP  omeprazole (PRILOSEC) 40 MG capsule Take 40 mg by mouth. 11/02/22   [provider]  rosuvastatin  (CRESTOR ) 40 MG tablet Take 1 tablet (40 mg total) by mouth daily at 6 PM. 06/23/23   Henry Shaver NOVAK, NP    Allergies: Benadryl [diphenhydramine], Depen [penicillamine], Jardiance [empagliflozin], Vibra-tab [doxycycline], Z-pak [azithromycin], Amoxil [amoxicillin], Avsola  [infliximab ], Glucophage [metformin], Glucotrol [glipizide], Lipitor [atorvastatin ], and Prednisone     Review of Systems  Updated Vital Signs BP (!) 124/92   Pulse 90  Temp 99.2 F (37.3 C) (Oral)   Resp 18   Ht 5' 5 (1.651 m)   Wt 63.5 kg   LMP 08/07/2016   SpO2 96%   BMI 23.30 kg/m   Physical Exam Vitals and nursing note reviewed.  Constitutional:      General: She is not in acute distress.    Appearance: She is well-developed.  HENT:     Head: Normocephalic and atraumatic.     Right Ear: External ear normal.     Left Ear: External ear normal.     Nose: Nose normal.  Eyes:     Extraocular Movements: Extraocular movements  intact.     Conjunctiva/sclera: Conjunctivae normal.     Pupils: Pupils are equal, round, and reactive to light.  Cardiovascular:     Rate and Rhythm: Normal rate and regular rhythm.     Heart sounds: No murmur heard.    Comments: Radial pulses 2+ bilaterally Pulmonary:     Effort: Pulmonary effort is normal. No respiratory distress.     Breath sounds: Normal breath sounds.  Abdominal:     General: Abdomen is flat. There is no distension.     Palpations: Abdomen is soft. There is no mass.     Tenderness: There is no abdominal tenderness. There is no guarding.  Musculoskeletal:     Cervical back: Normal range of motion and neck supple.     Right lower leg: No edema.     Left lower leg: No edema.  Skin:    General: Skin is warm and dry.     Findings: No rash.  Neurological:     Mental Status: She is alert and oriented to person, place, and time. Mental status is at baseline.  Psychiatric:        Mood and Affect: Mood normal.     (all labs ordered are listed, but only abnormal results are displayed) Labs Reviewed  BASIC METABOLIC PANEL WITH GFR  CBC  TROPONIN I (HIGH SENSITIVITY)    EKG: None  Radiology: No results found.  {Document cardiac monitor, telemetry assessment procedure when appropriate:32947} Procedures   Medications Ordered in the ED - No data to display    {Click here for ABCD2, HEART and other calculators REFRESH Note before signing:1}                              Medical Decision Making Amount and/or Complexity of Data Reviewed Labs: ordered. Radiology: ordered.   ***  {Document critical care time when appropriate  Document review of labs and clinical decision tools ie CHADS2VASC2, etc  Document your independent review of radiology images and any outside records  Document your discussion with family members, caretakers and with consultants  Document social determinants of health affecting pt's care  Document your decision making why or why not  admission, treatments were needed:32947:::1}   Final diagnoses:  None    ED Discharge Orders     None

## 2024-07-25 NOTE — ED Notes (Addendum)
 Pt states that she feels fine now. MD at bedside and pt states that she's willing to stay and get bloodwork done. Pt has no complaints upon assessment no abnormalities.

## 2024-07-25 NOTE — Progress Notes (Signed)
 EMS called regarding patient. Central CP started at 1036 radiating to her back.  VS taken. Emergency meds given. Charge RN called and given report.

## 2024-07-25 NOTE — Progress Notes (Signed)
 I was reached by the infusion center staff nurses regarding the patient presenting new onset chest pain radiating to her back after receiving the Avsola  infusion.  She was diaphoretic but did not have any shortness of breath.  No other associated symptoms.  Vital signs were stable.  I examined the patient who appeared to be in mild distress but no other abnormalities were found.  Patient had received Solu-Medrol  and loratadine , did not receive Benadryl as she is allergic to this medication. EMS was reached and they will be transferring the patient. I reach the ER (Dr. Yolande) to discuss transfer to the ER for further evaluation.

## 2024-07-25 NOTE — Telephone Encounter (Signed)
 [name] calling due to patient experiencing reaction to Avsola  infusion. Patient with chest pain radiating to back.  No anaphylaxis symptoms reported at this time. Unclear if true infusion reaction or alternative etiology at this time. Emergency medications provided to patient, vitals reported to be stable with mild tachycardia at this time. 911 called. No EKG available at infusion center. Advised patient needs to be sent to the Emergency Room immediately for evaluation, especially given chest pain and hx of CAD. Will follow-up with patient after ED visit to discuss options moving forward in regards to medication options.  [name], DO

## 2024-07-25 NOTE — Progress Notes (Signed)
 1130-pt notified RN that she was having chest pain radiating to her back. Her chest pain she describes as pressure/discomfort/throbbing.  No hives/itching/rashes noted. Vital signs taken.  BP 124/91 O2 99% HR 79 Pts infusion stopped.  1138-solu-medrol  IV 125 mg given Pepcid  IVPB 20mg  given. Normal Saline bolus started.  1144- [name] DO notified with no further instructions given.  1145-Vital signs taken.  BP 133/104 O2 100% HR 77  1150-Castaneda notified and at bedside. EMS called. Blood pressure taken.  BP 145/109  1155- EMS arrived in clinic.   1205-Pt discharged via stretcher with EMS to [facility] ER.

## 2024-07-26 ENCOUNTER — Other Ambulatory Visit: Payer: Self-pay

## 2024-07-26 ENCOUNTER — Telehealth: Payer: Self-pay

## 2024-07-26 DIAGNOSIS — Z79899 Other long term (current) drug therapy: Secondary | ICD-10-CM

## 2024-07-26 NOTE — Telephone Encounter (Signed)
 Patient called to schedule a 2 week follow-up appointment.  Patient was offered a couple of different days/times.  Patient states she is only available between 9:30 -11:00 am.  Please advise.

## 2024-07-26 NOTE — Telephone Encounter (Signed)
 Patient contacted the office to advise she had an allergic reaction to her infusion and went to the emergency room. Patient spoke with Dr. Luba about the reaction and Dr. Luba advised the patient to come to the office for an antibody test and then schedule a follow up in two weeks. Patient states she can come into the office today or tomorrow and have the lab drawn. Patient will schedule an appointment when she comes in for labs.

## 2024-07-26 NOTE — Addendum Note (Signed)
 Addended by: Nickey Canedo P on: 07/26/2024 09:22 AM   Modules accepted: Orders

## 2024-07-30 LAB — INFLIXIMAB ADA, RHEUM: INFLIXIMAB ADA, RHEUM: >100 [AU] — ABNORMAL HIGH (ref <10)

## 2024-08-07 ENCOUNTER — Ambulatory Visit: Payer: Self-pay | Admitting: *Deleted

## 2024-08-22 ENCOUNTER — Ambulatory Visit

## 2024-08-22 ENCOUNTER — Encounter

## 2024-08-27 ENCOUNTER — Ambulatory Visit

## 2024-08-28 NOTE — Progress Notes (Signed)
 Virtual Visit via Video Note  I connected with [name] on 09/10/24 at 10:40 AM EDT by a video enabled telemedicine application and verified that I am speaking with the correct person using two identifiers.  Location: Patient: Home Provider: Office   I discussed the limitations of evaluation and management by telemedicine and the availability of in person appointments. The patient expressed understanding and agreed to proceed.  Review of Systems  Constitutional:  Negative for malaise/fatigue.  HENT:  Negative for hearing loss.   Eyes:  Negative for pain and redness.  Respiratory:  Negative for shortness of breath.   Cardiovascular:  Negative for chest pain and palpitations.  Gastrointestinal:  Negative for blood in stool, constipation and diarrhea.  Genitourinary:  Negative for frequency and urgency.  Musculoskeletal:  Positive for joint pain and myalgias.  Skin:  Negative for rash.  Neurological:  Negative for dizziness and headaches.  Endo/Heme/Allergies:  Does not bruise/bleed easily.  Psychiatric/Behavioral:  Negative for depression. The patient has insomnia. The patient is not nervous/anxious.     Patient reports morning stiffness for 2 hours.   Patient denies nocturnal pain.  Difficulty dressing/grooming: Reports Difficulty climbing stairs: Reports Difficulty getting out of chair: Reports Difficulty using hands for taps, buttons, cutlery, and/or writing: Reports   History of Present Illness: [name] is a 53 y.o. female who presents today for video visit to discuss medication options for seropositive RA. Patient was last seen 06/29/24 as a new patient to establish care. She wished to resume Remicade  given improvement in the past. Patient restarted on Arava  (with instructions to increase dose to 20mg  if labs stable at two week lab follow-up) and started on Remicade . Patient with severe allergic reaction to 2nd Remicade  infusion. Video visit today to determine  next best steps moving forward.  Patient currently doing okay. She is on Arava  10mg  daily, did not go up to 20mg  dosing. She feels that her joints are not in a severe flare, but is experiencing episodic joint pains that correlate with the cold weather. She wishes to see a homeopathic physician who treats arthritis to discuss alternative medication options.   Discussed options limited given her reactions to medications in the past. She does not wish to do any infusion medications given her recent reaction. She also does not want to give an injection to herself given how easily she has allergic reactions to these medications.   Assessment and Plan: Rheumatoid arthritis involving multiple sites with positive rheumatoid factor (HCC)  Patient with seropositive RA, currently not on any DMARD therapy, with evidence of active disease on recent examination (CDAI 30). Patient s/p allergic rxn to Remicade . After discussion with patient, will proceed with Cimzia.  Reviewed side effects including headache, rash, positive ANA, drug-induced lupus, antibody development, infection, injection site reaction.  QuantiFERON and hepatitis previously checked. ACR handout will be sent to patient.    Pending laboratory results, will also increase Arava  to 20mg  daily. However, I am concerned about recent laboratory work-up that demonstrated thrombocytopenia and mild elevation in liver function. Patient will repeat labs this week, pending those results, will make further decisions regarding Arava  dosing.  Follow Up Instructions: Follow-up in 3 months   I discussed the assessment and treatment plan with the patient. The patient was provided an opportunity to ask questions and all were answered. The patient agreed with the plan and demonstrated an understanding of the instructions.   The patient was advised to call back or seek an in-person evaluation  if the symptoms worsen or if the condition fails to improve as  anticipated.  I provided 25 minutes of non-face-to-face time during this encounter.   [name], DO

## 2024-09-10 ENCOUNTER — Encounter

## 2024-09-10 ENCOUNTER — Telehealth: Payer: Self-pay

## 2024-09-10 VITALS — Ht 65.0 in | Wt 140.0 lb

## 2024-09-10 DIAGNOSIS — Z79899 Other long term (current) drug therapy: Secondary | ICD-10-CM | POA: Diagnosis not present

## 2024-09-10 DIAGNOSIS — M0579 Rheumatoid arthritis with rheumatoid factor of multiple sites without organ or systems involvement: Secondary | ICD-10-CM | POA: Diagnosis not present

## 2024-09-10 NOTE — Telephone Encounter (Addendum)
 Patient's in-office Cimzia case uploaded into [employer] portal. Patient notified that she will receive text message to obtain patient consent for benefits investigation  Will await BIV  Case # [id number]  ----- Message from Sherry GORMAN Pennant sent at 09/10/2024 12:24 PM EDT ----- Per Dr. Luba, will be in-office Cimzia ----- Message ----- From: Luba Stabs, DO Sent: 09/10/2024  12:04 PM EDT To: Rx Rheum/Pulm  Patient to start Cimzia for seropositive RA.

## 2024-09-11 NOTE — Telephone Encounter (Signed)
 Received verification of benefits from Cimzia. Cimzia is covered by [facility] Dual plan. Office is in network. PA is not required but recommendation is to submit for coverage on [employer] portal. OOP max is $9350 (patient has met $1159 as if 09/11/2024). Patient has met $257 Part B deductible.  They recommend verifying continued enrollment in Medicaid plan on a monthly basis.  Case ID: [id number] Case manager: Evalene Das [phone], option 2, option 1, ext [phone])  Sherry Pennant, PharmD, MPH, BCPS, CPP Clinical Pharmacist [facility])

## 2024-09-20 NOTE — Telephone Encounter (Signed)
 Per [employer] provider portal, authorization is not required for Cimzia [phone]). Organizational determination completed  Auth # [id number]    Patient can be scheduled for Cimzia new start. Her last Avsola  infusion was 07/25/2024. D/w Dr. Luba - will plan to start Cimzia 4 weeks after last Avsola  infusion (so any time now)  Sherry Pennant, PharmD, MPH, BCPS, CPP Clinical Pharmacist [facility])

## 2024-09-20 NOTE — Telephone Encounter (Signed)
 Patient has been scheduled for her new start visit on 10/04/2024. She has been scheduled on 10/18/2024 and 11/01/2024 to complete her loading dose.

## 2024-10-04 ENCOUNTER — Encounter: Admitting: *Deleted

## 2024-10-04 VITALS — BP 129/89 | HR 85

## 2024-10-04 DIAGNOSIS — M0579 Rheumatoid arthritis with rheumatoid factor of multiple sites without organ or systems involvement: Secondary | ICD-10-CM | POA: Insufficient documentation

## 2024-10-04 MED ORDER — CERTOLIZUMAB PEGOL 2 X 200 MG ~~LOC~~ KIT
400.0000 mg | PACK | Freq: Once | SUBCUTANEOUS | Status: AC
  Administered 2024-10-04: 400 mg via SUBCUTANEOUS

## 2024-10-04 NOTE — Progress Notes (Signed)
 Subjective:   Patient presents to clinic today to receive monthly dose of Cimzia.  Patient running a fever or have signs/symptoms of infection? No  Patient currently on antibiotics for the treatment of infection? No  Patient have any upcoming invasive procedures/surgeries? No  Objective: CMP     Component Value Date/Time   NA 137 07/25/2024 1254   K 3.6 07/25/2024 1254   CL 108 07/25/2024 1254   CO2 20 (L) 07/25/2024 1254   GLUCOSE 144 (H) 07/25/2024 1254   BUN 6 07/25/2024 1254   CREATININE 0.97 07/25/2024 1254   CREATININE 1.24 (H) 06/29/2024 0000   CALCIUM  8.2 (L) 07/25/2024 1254   PROT 7.5 06/29/2024 0000   ALBUMIN 3.3 (L) 06/21/2023 0541   AST 12 06/29/2024 0000   ALT 7 06/29/2024 0000   ALKPHOS 86 06/21/2023 0541   BILITOT 0.4 06/29/2024 0000   GFRNONAA >60 07/25/2024 1254    CBC    Component Value Date/Time   WBC 9.5 07/25/2024 1254   RBC 4.02 07/25/2024 1254   HGB 12.4 07/25/2024 1254   HCT 37.7 07/25/2024 1254   PLT 326 07/25/2024 1254   MCV 93.8 07/25/2024 1254   MCH 30.8 07/25/2024 1254   MCHC 32.9 07/25/2024 1254   RDW 14.3 07/25/2024 1254   LYMPHSABS 3.0 09/27/2017 1357   MONOABS 0.4 09/27/2017 1357   EOSABS 201 06/29/2024 0000   BASOSABS 95 06/29/2024 0000    Baseline Immunosuppressant Therapy Labs TB GOLD    Latest Ref Rng & Units 06/29/2024   12:00 AM  Quantiferon TB Gold  Quantiferon TB Gold Plus NEGATIVE NEGATIVE    Hepatitis Panel    Latest Ref Rng & Units 06/29/2024   12:00 AM  Hepatitis  Hep B Surface Ag NON-REACTIVE NON-REACTIVE   Hep B IgM NON-REACTIVE NON-REACTIVE   Hep C Ab NON-REACTIVE NON-REACTIVE    HIV No results found for: HIV Immunoglobulins   SPEP    Latest Ref Rng & Units 06/29/2024   12:00 AM  Serum Protein Electrophoresis  Total Protein 6.1 - 8.1 g/dL 7.5    H3EI No results found for: G6PDH TPMT No results found for: TPMT   Chest x-ray: 07/25/2024 No active cardiopulmonary disease is  radiographically apparent.   Assessment/Plan:   Administrations This Visit     certolizumab pegol (CIMZIA) kit 400 mg     Admin Date 10/04/2024 Action Given Dose 400 mg Route Subcutaneous Documented By Cena Alfonso CROME, LPN             Patient tolerated injection well.   Appointment for next injection scheduled for 10/18/2024.  Patient due for labs in December 2025.  Patient is to call and reschedule appointment if running a fever with signs/symptoms of infection, on antibiotics for active infection or has an upcoming invasive procedure.  All questions encouraged and answered.  Instructed patient to call with any further questions or concerns.

## 2024-10-17 ENCOUNTER — Emergency Department (HOSPITAL_COMMUNITY)
Admission: EM | Admit: 2024-10-17 | Discharge: 2024-10-17 | Disposition: A | Discharge status: Home / Self Care | Attending: Emergency Medicine | Admitting: Emergency Medicine

## 2024-10-17 ENCOUNTER — Other Ambulatory Visit: Payer: Self-pay

## 2024-10-17 ENCOUNTER — Emergency Department (HOSPITAL_COMMUNITY)

## 2024-10-17 ENCOUNTER — Encounter (HOSPITAL_COMMUNITY): Payer: Self-pay

## 2024-10-17 DIAGNOSIS — S61411A Laceration without foreign body of right hand, initial encounter: Secondary | ICD-10-CM

## 2024-10-17 HISTORY — DX: Lymphedema, not elsewhere classified: I89.0

## 2024-10-17 MED ORDER — LIDOCAINE HCL (PF) 1 % IJ SOLN
5.0000 mL | Freq: Once | INTRAMUSCULAR | Status: AC
  Administered 2024-10-17: 5 mL
  Filled 2024-10-17: qty 5

## 2024-10-17 MED ORDER — TETANUS-DIPHTH-ACELL PERTUSSIS 5-2-15.5 LF-MCG/0.5 IM SUSP
0.5000 mL | Freq: Once | INTRAMUSCULAR | Status: AC
  Administered 2024-10-17: 0.5 mL via INTRAMUSCULAR
  Filled 2024-10-17: qty 0.5

## 2024-10-17 NOTE — ED Triage Notes (Signed)
 Pt arrived via POV c/o laceration to her left posterior hand. Pt reports she was using a knife to cut a zip-tie off of an item she purchased and the knife slipped, stabbing herself in her left hand. Bleeding controlled at this time. Dressing placed in Triage. Media File added to Pt's chart.

## 2024-10-17 NOTE — ED Provider Notes (Signed)
 [place] [facility]ERGENCY DEPARTMENT AT [facility] Provider Note   CSN: [id number] Arrival date & time: 10/17/24  1406     Patient presents with: Laceration   [name] is a 53 y.o. female.    Laceration Associated symptoms: no fever        [name] is a 52 y.o. female history of type 2 diabetes, prior MI, anticoagulated due to PE who presents to the Emergency Department for evaluation of a laceration to her right hand.  She states that she was putting up Christmas decorations when she accidentally jabbed her hand with a knife.  She denies any swelling, numbness or weakness of her hand fingers or wrist.  Last Td is unknown.  She controlled bleeding prior to arrival with pressure  Prior to Admission medications   Medication Sig Start Date End Date Taking? Authorizing Provider  albuterol (VENTOLIN HFA) 108 (90 Base) MCG/ACT inhaler INHALE 2 PUFFS BY MOUTH EVERY 6 HOURS AS NEEDED FOR WHEEZING    [provider]  apixaban (ELIQUIS) 5 MG TABS tablet Take 5 mg by mouth. 05/30/24   [provider]  aspirin  EC 81 MG tablet Take 81 mg by mouth daily.    [provider]  atorvastatin  (LIPITOR) 40 MG tablet Take 40 mg by mouth daily. Patient not taking: Reported on 09/10/2024    [provider]  buPROPion (WELLBUTRIN XL) 150 MG 24 hr tablet Take 150 mg by mouth daily. 06/28/23   [provider]  colchicine  0.6 MG tablet Take 1 tablet (0.6 mg total) by mouth 2 (two) times daily. Patient not taking: Reported on 09/10/2024 06/23/23   Henry Shaver B, NP  Dulaglutide (TRULICITY) 0.75 MG/0.5ML SOPN Inject 0.75 mg into the skin every Sunday.    [provider]  escitalopram (LEXAPRO) 20 MG tablet Take 20 mg by mouth daily. Patient not taking: Reported on 09/10/2024 06/28/23   [provider]  fluticasone (FLONASE) 50 MCG/ACT nasal spray Place 2 sprays into both nostrils daily as needed for allergies.    [provider]  folic acid (FOLVITE) 1 MG tablet Take 1 mg by mouth daily.    [provider]  inFLIXimab -axxq (AVSOLA  IV) Infuse 3 mg/kg at 0, 2, and 6 weeks then every 8 weeks thereafter Patient not taking: Reported on 09/10/2024    [provider]  leflunomide  (ARAVA ) 10 MG tablet Take 1 tablet (10 mg) po daily x 2 weeks. If labs are stable, take 2 tablets (20 mg) daily. 07/02/24   Szer, Asberry, DO  metoprolol  tartrate (LOPRESSOR ) 25 MG tablet Take 1 tablet (25 mg total) by mouth 2 (two) times daily. 06/23/23   Henry Shaver NOVAK, NP  nitroGLYCERIN  (NITROSTAT ) 0.4 MG SL tablet Place 1 tablet (0.4 mg total) under the tongue every 5 (five) minutes x 3 doses as needed for chest pain. 06/23/23   Henry Shaver NOVAK, NP  omeprazole (PRILOSEC) 40 MG capsule Take 40 mg by mouth. 11/02/22   [provider]  rosuvastatin  (CRESTOR ) 40 MG tablet Take 1 tablet (40 mg total) by mouth daily at 6 PM. Patient not taking: Reported on 09/10/2024 06/23/23   Henry Shaver NOVAK, NP    Allergies: Benadryl [diphenhydramine], Depen [penicillamine], Jardiance [empagliflozin], Vibra-tab [doxycycline], Z-pak [azithromycin], Amoxil [amoxicillin], Avsola  [infliximab ], Glucophage [metformin], Glucotrol [glipizide], Lipitor [atorvastatin ], and Prednisone     Review of Systems  Constitutional:  Negative for chills and fever.  Respiratory:  Negative for cough and shortness of breath.  Cardiovascular:  Negative for chest pain.  Gastrointestinal:  Negative for nausea and vomiting.  Skin:  Positive for wound.       Laceration right hand  Neurological:  Negative for weakness and numbness.    Updated Vital Signs BP (!) 134/99 (BP Location: Left Arm)   Pulse 92   Temp 98.2 F (36.8 C) (Oral)   Resp 16   Ht 5' 5 (1.651 m)   Wt 63.5 kg   LMP 08/07/2016   SpO2 97%   BMI 23.30 kg/m   Physical Exam Vitals and nursing note reviewed.  Constitutional:      General: She is not in acute distress.     Appearance: Normal appearance.  Cardiovascular:     Rate and Rhythm: Normal rate and regular rhythm.     Pulses: Normal pulses.  Pulmonary:     Effort: Pulmonary effort is normal.  Musculoskeletal:     Right hand: Laceration present. No bony tenderness. Normal range of motion. Normal strength. Normal sensation. There is no disruption of two-point discrimination. Normal capillary refill. Normal pulse.     Comments: 1 cm laceration dorsal aspect of the right hand.  No edema, no active bleeding.  Laceration appears superficial.  She has full range of motion to all the fingers of the right hand.  Normal finger thumb opposition.  Wrist nontender  Skin:    General: Skin is warm.     Capillary Refill: Capillary refill takes less than 2 seconds.  Neurological:     General: No focal deficit present.     Mental Status: She is alert.     Sensory: No sensory deficit.     Motor: No weakness.     (all labs ordered are listed, but only abnormal results are displayed) Labs Reviewed - No data to display  EKG: None  Radiology: DG Hand Complete Left Result Date: 10/17/2024 EXAM: 3 or more VIEW(S) XRAY OF THE LEFT HAND 10/17/2024 02:52:53 PM COMPARISON: None available. CLINICAL HISTORY: laceration FINDINGS: BONES AND JOINTS: No acute fracture. No malalignment. SOFT TISSUES: The soft tissues are unremarkable. IMPRESSION: 1. No acute fracture or dislocation. Electronically signed by: Toribio Agreste MD 10/17/2024 03:36 PM EST RP Workstation: [id number]     Procedures    LACERATION REPAIR Performed by: Caylin Nass Authorized by: Joella Saefong Consent: Verbal consent obtained. Risks and benefits: risks, benefits and alternatives were discussed Consent given by: patient Patient identity confirmed: provided demographic data Prepped and Draped in normal sterile fashion Wound explored  Laceration Location: Dorsal right hand  Laceration Length: 1 cm  No Foreign Bodies seen or  palpated  Anesthesia: local infiltration  Local anesthetic: lidocaine  1% without epinephrine  Anesthetic total: 2 ml  Irrigation method: syringe Amount of cleaning: standard  Skin closure: 4-0 Prolene  Number of sutures: 2  Technique: Simple interrupted  Patient tolerance: Patient tolerated the procedure well with no immediate complications.   Medications Ordered in the ED  lidocaine  (PF) (XYLOCAINE ) 1 % injection 5 mL (has no administration in time range)  Tdap (ADACEL ) injection 0.5 mL (0.5 mLs Intramuscular Given 10/17/24 1432)                                    Medical Decision Making   Patient here for evaluation of laceration to her dorsal right hand that occurred while using a knife.  Hemostasis obtained prior to my exam.  She has full  range of motion of the wrist and fingers of the right hand.  Denies any distal weakness.  Last Td unknown.   Suspect superficial injury.  No foreign body seen on exam.  Entire depth of wound explored and explored through range of motion prior to closure no obvious injuries of the deep structures.  Neurovascularly intact  Tdap updated here  Amount and/or Complexity of Data Reviewed Radiology: ordered.    Details: X-ray without evidence of radiographic foreign body or bony injury Discussion of management or test interpretation with external provider(s):   Successful laceration repair.  Td updated here.  Wound well-approximated.  She was given wound care instructions sutures out in 8 to 10 days.  Return precautions were also given  Risk Prescription drug management.        Final diagnoses:  Laceration of right hand without foreign body, initial encounter    ED Discharge Orders     None          Herlinda Milling, PA-C 10/20/24 1317    Suzette Pac, MD 10/21/24 [phone]

## 2024-10-17 NOTE — Discharge Instructions (Signed)
 Keep the wound clean with mild soap and water.  You may keep the area bandaged.  Elevate your hand when possible to avoid swelling.  Sutures out in 8 to 10 days.  Return to the emergency department if you develop any new or worsening symptoms or signs of infection. Your Xray did not show any broken bones

## 2024-10-18 ENCOUNTER — Encounter: Admitting: *Deleted

## 2024-10-18 VITALS — BP 147/94 | HR 111

## 2024-10-18 DIAGNOSIS — M0579 Rheumatoid arthritis with rheumatoid factor of multiple sites without organ or systems involvement: Secondary | ICD-10-CM | POA: Insufficient documentation

## 2024-10-18 MED ORDER — CERTOLIZUMAB PEGOL 2 X 200 MG ~~LOC~~ KIT
400.0000 mg | PACK | Freq: Once | SUBCUTANEOUS | Status: AC
  Administered 2024-10-18: 400 mg via SUBCUTANEOUS

## 2024-10-18 NOTE — Progress Notes (Signed)
 Pharmacy Note  Subjective:   Patient presents to clinic today to receive monthly dose of Cimzia .  Patient running a fever or have signs/symptoms of infection? No  Patient currently on antibiotics for the treatment of infection? No  Patient have any upcoming invasive procedures/surgeries? No  Objective: CMP     Component Value Date/Time   NA 137 07/25/2024 1254   K 3.6 07/25/2024 1254   CL 108 07/25/2024 1254   CO2 20 (L) 07/25/2024 1254   GLUCOSE 144 (H) 07/25/2024 1254   BUN 6 07/25/2024 1254   CREATININE 0.97 07/25/2024 1254   CREATININE 1.24 (H) 06/29/2024 0000   CALCIUM  8.2 (L) 07/25/2024 1254   PROT 7.5 06/29/2024 0000   ALBUMIN 3.3 (L) 06/21/2023 0541   AST 12 06/29/2024 0000   ALT 7 06/29/2024 0000   ALKPHOS 86 06/21/2023 0541   BILITOT 0.4 06/29/2024 0000   GFRNONAA >60 07/25/2024 1254    CBC    Component Value Date/Time   WBC 9.5 07/25/2024 1254   RBC 4.02 07/25/2024 1254   HGB 12.4 07/25/2024 1254   HCT 37.7 07/25/2024 1254   PLT 326 07/25/2024 1254   MCV 93.8 07/25/2024 1254   MCH 30.8 07/25/2024 1254   MCHC 32.9 07/25/2024 1254   RDW 14.3 07/25/2024 1254   LYMPHSABS 3.0 09/27/2017 1357   MONOABS 0.4 09/27/2017 1357   EOSABS 201 06/29/2024 0000   BASOSABS 95 06/29/2024 0000    Baseline Immunosuppressant Therapy Labs TB GOLD    Latest Ref Rng & Units 06/29/2024   12:00 AM  Quantiferon TB Gold  Quantiferon TB Gold Plus NEGATIVE NEGATIVE    Hepatitis Panel    Latest Ref Rng & Units 06/29/2024   12:00 AM  Hepatitis  Hep B Surface Ag NON-REACTIVE NON-REACTIVE   Hep B IgM NON-REACTIVE NON-REACTIVE   Hep C Ab NON-REACTIVE NON-REACTIVE    HIV No results found for: HIV Immunoglobulins   SPEP    Latest Ref Rng & Units 06/29/2024   12:00 AM  Serum Protein Electrophoresis  Total Protein 6.1 - 8.1 g/dL 7.5    H3EI No results found for: G6PDH TPMT No results found for: TPMT   Chest x-ray: 07/25/2024 No active cardiopulmonary disease  is radiographically apparent   Assessment/Plan:   Administrations This Visit     certolizumab pegol  (CIMZIA ) kit 400 mg     Admin Date 10/18/2024 Action Given Dose 400 mg Route Subcutaneous Documented By Cena Alfonso CROME, LPN             Patient tolerated injection well.   Appointment for next injection scheduled for 11/01/2024.  Patient due for labs in December 2025.  Patient is to call and reschedule appointment if running a fever with signs/symptoms of infection, on antibiotics for active infection or has an upcoming invasive procedure.  All questions encouraged and answered.  Instructed patient to call with any further questions or concerns.

## 2024-11-01 ENCOUNTER — Encounter

## 2024-11-01 VITALS — BP 132/88 | HR 101

## 2024-11-01 DIAGNOSIS — M0579 Rheumatoid arthritis with rheumatoid factor of multiple sites without organ or systems involvement: Secondary | ICD-10-CM | POA: Diagnosis not present

## 2024-11-01 MED ORDER — CERTOLIZUMAB PEGOL 2 X 200 MG ~~LOC~~ KIT
400.0000 mg | PACK | Freq: Once | SUBCUTANEOUS | Status: AC
  Administered 2024-11-01: 15:00:00 400 mg via SUBCUTANEOUS

## 2024-11-01 NOTE — Progress Notes (Signed)
 Subjective:   Patient presents to clinic today to receive monthly dose of Cimzia .  Patient running a fever or have signs/symptoms of infection? No  Patient currently on antibiotics for the treatment of infection? No  Patient have any upcoming invasive procedures/surgeries? No  Objective: CMP     Component Value Date/Time   NA 137 07/25/2024 1254   K 3.6 07/25/2024 1254   CL 108 07/25/2024 1254   CO2 20 (L) 07/25/2024 1254   GLUCOSE 144 (H) 07/25/2024 1254   BUN 6 07/25/2024 1254   CREATININE 0.97 07/25/2024 1254   CREATININE 1.24 (H) 06/29/2024 0000   CALCIUM  8.2 (L) 07/25/2024 1254   PROT 7.5 06/29/2024 0000   ALBUMIN 3.3 (L) 06/21/2023 0541   AST 12 06/29/2024 0000   ALT 7 06/29/2024 0000   ALKPHOS 86 06/21/2023 0541   BILITOT 0.4 06/29/2024 0000   GFRNONAA >60 07/25/2024 1254    CBC    Component Value Date/Time   WBC 9.5 07/25/2024 1254   RBC 4.02 07/25/2024 1254   HGB 12.4 07/25/2024 1254   HCT 37.7 07/25/2024 1254   PLT 326 07/25/2024 1254   MCV 93.8 07/25/2024 1254   MCH 30.8 07/25/2024 1254   MCHC 32.9 07/25/2024 1254   RDW 14.3 07/25/2024 1254   LYMPHSABS 3.0 09/27/2017 1357   MONOABS 0.4 09/27/2017 1357   EOSABS 201 06/29/2024 0000   BASOSABS 95 06/29/2024 0000    Baseline Immunosuppressant Therapy Labs TB GOLD    Latest Ref Rng & Units 06/29/2024   12:00 AM  Quantiferon TB Gold  Quantiferon TB Gold Plus NEGATIVE NEGATIVE    Hepatitis Panel    Latest Ref Rng & Units 06/29/2024   12:00 AM  Hepatitis  Hep B Surface Ag NON-REACTIVE NON-REACTIVE   Hep B IgM NON-REACTIVE NON-REACTIVE   Hep C Ab NON-REACTIVE NON-REACTIVE    HIV No results found for: HIV Immunoglobulins   SPEP    Latest Ref Rng & Units 06/29/2024   12:00 AM  Serum Protein Electrophoresis  Total Protein 6.1 - 8.1 g/dL 7.5    H3EI No results found for: G6PDH TPMT No results found for: TPMT   Chest x-ray: 07/25/2024 No active cardiopulmonary disease is  radiographically apparent   Assessment/Plan:   Administrations This Visit     certolizumab pegol  (CIMZIA ) kit 400 mg     Admin Date 11/01/2024 Action Given Dose 400 mg Route Subcutaneous Documented By Burl Francina HERO, CMA             Patient tolerated injection well.   Appointment for next injection scheduled for November 29, 2024.  Patient due for labs in January.  Patient is to call and reschedule appointment if running a fever with signs/symptoms of infection, on antibiotics for active infection or has an upcoming invasive procedure.  All questions encouraged and answered.  Instructed patient to call with any further questions or concerns.

## 2024-11-24 ENCOUNTER — Telehealth: Payer: Self-pay | Admitting: Pharmacist

## 2024-11-24 NOTE — Telephone Encounter (Signed)
 Patient has active [facility] Medicare/dual plan. [facility] is in-network. G9282 does not require pre-certification but one has been generated in [facility] provider portal  Auth # [id number]    Sherry Pennant, PharmD, MPH, BCPS, CPP Clinical Pharmacist

## 2024-11-29 ENCOUNTER — Encounter: Admitting: *Deleted

## 2024-11-29 VITALS — BP 134/92 | HR 108

## 2024-11-29 DIAGNOSIS — Z79899 Other long term (current) drug therapy: Secondary | ICD-10-CM | POA: Insufficient documentation

## 2024-11-29 DIAGNOSIS — M0579 Rheumatoid arthritis with rheumatoid factor of multiple sites without organ or systems involvement: Secondary | ICD-10-CM | POA: Insufficient documentation

## 2024-11-29 MED ORDER — CERTOLIZUMAB PEGOL 2 X 200 MG ~~LOC~~ KIT
400.0000 mg | PACK | Freq: Once | SUBCUTANEOUS | Status: AC
  Administered 2024-11-29: 400 mg via SUBCUTANEOUS

## 2024-11-29 NOTE — Progress Notes (Signed)
 Pharmacy Note  Subjective:   Patient presents to clinic today to receive monthly dose of Cimzia .  Patient running a fever or have signs/symptoms of infection? No  Patient currently on antibiotics for the treatment of infection? No  Patient have any upcoming invasive procedures/surgeries? No  Objective: CMP     Component Value Date/Time   NA 137 07/25/2024 1254   K 3.6 07/25/2024 1254   CL 108 07/25/2024 1254   CO2 20 (L) 07/25/2024 1254   GLUCOSE 144 (H) 07/25/2024 1254   BUN 6 07/25/2024 1254   CREATININE 0.97 07/25/2024 1254   CREATININE 1.24 (H) 06/29/2024 0000   CALCIUM  8.2 (L) 07/25/2024 1254   PROT 7.5 06/29/2024 0000   ALBUMIN 3.3 (L) 06/21/2023 0541   AST 12 06/29/2024 0000   ALT 7 06/29/2024 0000   ALKPHOS 86 06/21/2023 0541   BILITOT 0.4 06/29/2024 0000   GFRNONAA >60 07/25/2024 1254    CBC    Component Value Date/Time   WBC 9.5 07/25/2024 1254   RBC 4.02 07/25/2024 1254   HGB 12.4 07/25/2024 1254   HCT 37.7 07/25/2024 1254   PLT 326 07/25/2024 1254   MCV 93.8 07/25/2024 1254   MCH 30.8 07/25/2024 1254   MCHC 32.9 07/25/2024 1254   RDW 14.3 07/25/2024 1254   LYMPHSABS 3.0 09/27/2017 1357   MONOABS 0.4 09/27/2017 1357   EOSABS 201 06/29/2024 0000   BASOSABS 95 06/29/2024 0000    Baseline Immunosuppressant Therapy Labs TB GOLD    Latest Ref Rng & Units 06/29/2024   12:00 AM  Quantiferon TB Gold  Quantiferon TB Gold Plus NEGATIVE NEGATIVE    Hepatitis Panel    Latest Ref Rng & Units 06/29/2024   12:00 AM  Hepatitis  Hep B Surface Ag NON-REACTIVE NON-REACTIVE   Hep B IgM NON-REACTIVE NON-REACTIVE   Hep C Ab NON-REACTIVE NON-REACTIVE    HIV No results found for: HIV Immunoglobulins   SPEP    Latest Ref Rng & Units 06/29/2024   12:00 AM  Serum Protein Electrophoresis  Total Protein 6.1 - 8.1 g/dL 7.5    H3EI No results found for: G6PDH TPMT No results found for: TPMT   Chest x-ray: 07/25/2024 No active cardiopulmonary disease  is radiographically apparent   Assessment/Plan:   Administrations This Visit     certolizumab pegol  (CIMZIA ) kit 400 mg     Admin Date 11/29/2024 Action Given Dose 400 mg Route Subcutaneous Documented By Cena Alfonso CROME, LPN             Patient tolerated injection well.   Appointment for next injection scheduled for 12/27/2024.  Patient due for labs and were drawn while in office.  Patient is to call and reschedule appointment if running a fever with signs/symptoms of infection, on antibiotics for active infection or has an upcoming invasive procedure.  All questions encouraged and answered.  Instructed patient to call with any further questions or concerns.

## 2024-11-30 LAB — CBC WITH DIFFERENTIAL/PLATELET
Absolute Lymphocytes: 3805 {cells}/uL (ref 850–3900)
Absolute Monocytes: 316 {cells}/uL (ref 200–950)
Basophils Absolute: 102 {cells}/uL (ref 0–200)
Basophils Relative: 1 %
Eosinophils Absolute: 602 {cells}/uL — ABNORMAL HIGH (ref 15–500)
Eosinophils Relative: 5.9 %
HCT: 44.7 % (ref 35.9–46.0)
Hemoglobin: 15 g/dL (ref 11.7–15.5)
MCH: 30.2 pg (ref 27.0–33.0)
MCHC: 33.6 g/dL (ref 31.6–35.4)
MCV: 89.9 fL (ref 81.4–101.7)
MPV: 10.3 fL (ref 7.5–12.5)
Monocytes Relative: 3.1 %
Neutro Abs: 5375 {cells}/uL (ref 1500–7800)
Neutrophils Relative %: 52.7 %
Platelets: 320 Thousand/uL (ref 140–400)
RBC: 4.97 Million/uL (ref 3.80–5.10)
RDW: 13.3 % (ref 11.0–15.0)
Total Lymphocyte: 37.3 %
WBC: 10.2 Thousand/uL (ref 3.8–10.8)

## 2024-11-30 LAB — COMPREHENSIVE METABOLIC PANEL WITH GFR
AG Ratio: 1.4 (calc) (ref 1.0–2.5)
ALT: 13 U/L (ref 6–29)
AST: 16 U/L (ref 10–35)
Albumin: 4.3 g/dL (ref 3.6–5.1)
Alkaline phosphatase (APISO): 113 U/L (ref 37–153)
BUN/Creatinine Ratio: 8 (calc) (ref 6–22)
BUN: 9 mg/dL (ref 7–25)
CO2: 22 mmol/L (ref 20–32)
Calcium: 9 mg/dL (ref 8.6–10.4)
Chloride: 106 mmol/L (ref 98–110)
Creat: 1.09 mg/dL — ABNORMAL HIGH (ref 0.50–1.03)
Globulin: 3 g/dL (ref 1.9–3.7)
Glucose, Bld: 131 mg/dL — ABNORMAL HIGH (ref 65–99)
Potassium: 3.5 mmol/L (ref 3.5–5.3)
Sodium: 138 mmol/L (ref 135–146)
Total Bilirubin: 0.4 mg/dL (ref 0.2–1.2)
Total Protein: 7.3 g/dL (ref 6.1–8.1)
eGFR: 61 mL/min/1.73m2

## 2024-12-06 ENCOUNTER — Telehealth: Payer: Self-pay | Admitting: *Deleted

## 2024-12-06 NOTE — Telephone Encounter (Signed)
 Patient contacted the office and requested her lab results. Please review and advise. Thanks!

## 2024-12-06 NOTE — Telephone Encounter (Signed)
 Patient advised blood cell counts, kidney function, and liver function all looked stable on their recent lab, which is a good result.

## 2024-12-20 NOTE — Progress Notes (Unsigned)
 "  Office Visit Note  Patient: [name]             Date of Birth: [birth year]           MRN: [id number]             PCP: Pura Lenis, MD Referring: Pura Lenis, MD Visit Date: 12/27/2024 Occupation: Data Unavailable  Subjective:  No chief complaint on file.   History of Present Illness: [name] is a 54 y.o. female with Rheumatoid Arthritis who is presenting for a 3 month follow up. She was last seen on 09/10/2024 where we applied for Cimzia . Her last Cimzia  injection was 11/29/2024.     Activities of Daily Living:  Patient reports morning stiffness for *** {minute/hour:19697}.   Patient {ACTIONS;DENIES/REPORTS:21021675::Denies} nocturnal pain.  Difficulty dressing/grooming: {ACTIONS;DENIES/REPORTS:21021675::Denies} Difficulty climbing stairs: {ACTIONS;DENIES/REPORTS:21021675::Denies} Difficulty getting out of chair: {ACTIONS;DENIES/REPORTS:21021675::Denies} Difficulty using hands for taps, buttons, cutlery, and/or writing: {ACTIONS;DENIES/REPORTS:21021675::Denies}  No Rheumatology ROS completed.   PMFS History:  Patient Active Problem List   Diagnosis Date Noted   Seropositive rheumatoid arthritis of multiple sites (HCC) 07/03/2024   High risk medication use 07/03/2024   Screening for tuberculosis 07/03/2024   Hyperlipidemia 06/23/2023   Type 2 diabetes mellitus with complication, without long-term current use of insulin  (HCC) 06/23/2023   Acute ST elevation myocardial infarction (STEMI) of inferior wall (HCC) 06/20/2023   STEMI involving left anterior descending coronary artery (HCC) 06/20/2023   Malignant neoplasm of female breast (HCC) 08/13/2008   Spontaneous abortion 08/13/2008   KNEE PAIN, RIGHT 08/13/2008   BACK PAIN, LUMBAR 08/13/2008    Past Medical History:  Diagnosis Date   Cancer (HCC)    Breast   Heart attack (HCC) 2024   Lymphedema    Rheumatoid arthritis (HCC)     Family History  Problem Relation Age of Onset   Diabetes  Mother    Hypertension Father    Hypertension Brother    Rheum arthritis Maternal Grandmother    Hypertension Other    Diabetes Other    Vitamin D deficiency Daughter    Past Surgical History:  Procedure Laterality Date   BREAST LUMPECTOMY Left    LEFT HEART CATH AND CORONARY ANGIOGRAPHY N/A 06/20/2023   Procedure: LEFT HEART CATH AND CORONARY ANGIOGRAPHY;  Surgeon: Court Dorn PARAS, MD;  Location: [facility] INVASIVE CV LAB;  Service: Cardiovascular;  Laterality: N/A;   Social History[1] Social History   Social History Narrative   Not on file     Immunization History  Administered Date(s) Administered   Moderna Sars-Covid-2 Vaccination 06/25/2020, 07/23/2020   Tdap 10/17/2024     Objective: Vital Signs: LMP 08/07/2016    Physical Exam   Musculoskeletal Exam: ***  CDAI Exam: CDAI Score: -- Patient Global: --; Provider Global: -- Swollen: --; Tender: -- Joint Exam 12/27/2024   No joint exam has been documented for this visit   There is currently no information documented on the homunculus. Go to the Rheumatology activity and complete the homunculus joint exam.  Investigation: No additional findings.  Imaging: No results found.  Recent Labs: Lab Results  Component Value Date   WBC 10.2 11/29/2024   HGB 15.0 11/29/2024   PLT 320 11/29/2024   NA 138 11/29/2024   K 3.5 11/29/2024   CL 106 11/29/2024   CO2 22 11/29/2024   GLUCOSE 131 (H) 11/29/2024   BUN 9 11/29/2024   CREATININE 1.09 (H) 11/29/2024   BILITOT 0.4 11/29/2024   ALKPHOS 86 06/21/2023  AST 16 11/29/2024   ALT 13 11/29/2024   PROT 7.3 11/29/2024   ALBUMIN 3.3 (L) 06/21/2023   CALCIUM  9.0 11/29/2024   QFTBGOLDPLUS NEGATIVE 06/29/2024    Speciality Comments: B/P in Right arm only  Procedures:  No procedures performed Allergies: Benadryl [diphenhydramine], Depen [penicillamine], Jardiance [empagliflozin], Vibra-tab [doxycycline], Z-pak [azithromycin], Amoxil [amoxicillin], Avsola  [infliximab ],  Glucophage [metformin], Glucotrol [glipizide], Lipitor [atorvastatin ], and Prednisone    Assessment / Plan:     Visit Diagnoses: No diagnosis found.  Orders: No orders of the defined types were placed in this encounter.  No orders of the defined types were placed in this encounter.   Face-to-face time spent with patient was *** minutes. Greater than 50% of time was spent in counseling and coordination of care.  Follow-Up Instructions: No follow-ups on file.   Alfonso Patterson, LPN  Note - This record has been created using [employer].  Chart creation errors have been sought, but may not always  have been located. Such creation errors do not reflect on  the standard of medical care.    [1]  Social History Tobacco Use   Smoking status: Some Days    Current packs/day: 0.50    Average packs/day: 0.5 packs/day for 10.0 years (5.0 ttl pk-yrs)    Types: Cigarettes    Passive exposure: Never   Smokeless tobacco: Never  Vaping Use   Vaping status: Never Used  Substance Use Topics   Alcohol use: No   Drug use: No   "

## 2024-12-26 ENCOUNTER — Encounter

## 2024-12-27 ENCOUNTER — Encounter

## 2024-12-27 DIAGNOSIS — M0579 Rheumatoid arthritis with rheumatoid factor of multiple sites without organ or systems involvement: Secondary | ICD-10-CM

## 2024-12-31 ENCOUNTER — Encounter
